# Patient Record
Sex: Male | Born: 1960 | Race: Black or African American | Hispanic: No
Health system: Southern US, Community
[De-identification: ages and names within clinical notes are randomized; demographics above are authoritative.]

## PROBLEM LIST (undated history)

## (undated) DIAGNOSIS — Z7901 Long term (current) use of anticoagulants: Secondary | ICD-10-CM

## (undated) DIAGNOSIS — I10 Essential (primary) hypertension: Secondary | ICD-10-CM

## (undated) DIAGNOSIS — I2699 Other pulmonary embolism without acute cor pulmonale: Secondary | ICD-10-CM

## (undated) DIAGNOSIS — F17200 Nicotine dependence, unspecified, uncomplicated: Secondary | ICD-10-CM

## (undated) DIAGNOSIS — I82409 Acute embolism and thrombosis of unspecified deep veins of unspecified lower extremity: Secondary | ICD-10-CM

## (undated) DIAGNOSIS — I2510 Atherosclerotic heart disease of native coronary artery without angina pectoris: Secondary | ICD-10-CM

## (undated) DIAGNOSIS — E785 Hyperlipidemia, unspecified: Secondary | ICD-10-CM

## (undated) DIAGNOSIS — Z9861 Coronary angioplasty status: Secondary | ICD-10-CM

## (undated) DIAGNOSIS — I219 Acute myocardial infarction, unspecified: Secondary | ICD-10-CM

## (undated) HISTORY — PX: CHOLECYSTECTOMY: SHX55

## (undated) HISTORY — PX: OTHER SURGICAL HISTORY: SHX169

---

## 1999-02-15 ENCOUNTER — Encounter: Payer: Self-pay | Admitting: Emergency Medicine

## 1999-02-16 ENCOUNTER — Inpatient Hospital Stay (HOSPITAL_COMMUNITY): Admission: EM | Admit: 1999-02-16 | Discharge: 1999-02-20 | Payer: Self-pay | Admitting: Emergency Medicine

## 1999-02-16 ENCOUNTER — Encounter: Payer: Self-pay | Admitting: General Surgery

## 1999-09-08 ENCOUNTER — Encounter: Payer: Self-pay | Admitting: Emergency Medicine

## 1999-09-08 ENCOUNTER — Emergency Department (HOSPITAL_COMMUNITY): Admission: EM | Admit: 1999-09-08 | Discharge: 1999-09-08 | Payer: Self-pay | Admitting: Emergency Medicine

## 1999-09-20 ENCOUNTER — Ambulatory Visit (HOSPITAL_COMMUNITY): Admission: RE | Admit: 1999-09-20 | Discharge: 1999-09-20 | Payer: Self-pay | Admitting: Hematology and Oncology

## 1999-09-20 ENCOUNTER — Encounter: Payer: Self-pay | Admitting: Hematology and Oncology

## 1999-09-20 ENCOUNTER — Encounter: Admission: RE | Admit: 1999-09-20 | Discharge: 1999-09-20 | Payer: Self-pay | Admitting: Hematology and Oncology

## 1999-10-02 ENCOUNTER — Encounter: Admission: RE | Admit: 1999-10-02 | Discharge: 1999-12-31 | Payer: Self-pay | Admitting: Orthopedic Surgery

## 1999-12-18 ENCOUNTER — Encounter (HOSPITAL_BASED_OUTPATIENT_CLINIC_OR_DEPARTMENT_OTHER): Payer: Self-pay | Admitting: Internal Medicine

## 2000-02-18 ENCOUNTER — Encounter: Admission: RE | Admit: 2000-02-18 | Discharge: 2000-05-18 | Payer: Self-pay | Admitting: Internal Medicine

## 2000-03-21 ENCOUNTER — Encounter: Payer: Self-pay | Admitting: Emergency Medicine

## 2000-03-21 ENCOUNTER — Emergency Department (HOSPITAL_COMMUNITY): Admission: EM | Admit: 2000-03-21 | Discharge: 2000-03-21 | Payer: Self-pay | Admitting: Emergency Medicine

## 2000-04-18 ENCOUNTER — Encounter: Payer: Self-pay | Admitting: Emergency Medicine

## 2000-04-18 ENCOUNTER — Encounter: Payer: Self-pay | Admitting: Internal Medicine

## 2000-04-18 ENCOUNTER — Inpatient Hospital Stay (HOSPITAL_COMMUNITY): Admission: EM | Admit: 2000-04-18 | Discharge: 2000-04-24 | Payer: Self-pay | Admitting: Emergency Medicine

## 2000-04-21 ENCOUNTER — Encounter: Payer: Self-pay | Admitting: Internal Medicine

## 2000-06-25 ENCOUNTER — Emergency Department (HOSPITAL_COMMUNITY): Admission: EM | Admit: 2000-06-25 | Discharge: 2000-06-25 | Payer: Self-pay

## 2000-06-25 ENCOUNTER — Encounter: Payer: Self-pay | Admitting: Emergency Medicine

## 2001-02-11 ENCOUNTER — Encounter: Admission: RE | Admit: 2001-02-11 | Discharge: 2001-02-23 | Payer: Self-pay | Admitting: Internal Medicine

## 2001-09-29 ENCOUNTER — Emergency Department (HOSPITAL_COMMUNITY): Admission: EM | Admit: 2001-09-29 | Discharge: 2001-09-29 | Payer: Self-pay | Admitting: *Deleted

## 2001-12-22 ENCOUNTER — Inpatient Hospital Stay (HOSPITAL_COMMUNITY): Admission: EM | Admit: 2001-12-22 | Discharge: 2001-12-30 | Payer: Self-pay | Admitting: Emergency Medicine

## 2001-12-22 ENCOUNTER — Encounter: Payer: Self-pay | Admitting: Emergency Medicine

## 2002-05-03 ENCOUNTER — Encounter (HOSPITAL_BASED_OUTPATIENT_CLINIC_OR_DEPARTMENT_OTHER): Admission: RE | Admit: 2002-05-03 | Discharge: 2002-07-21 | Payer: Self-pay | Admitting: Internal Medicine

## 2002-05-16 ENCOUNTER — Encounter: Payer: Self-pay | Admitting: Emergency Medicine

## 2002-05-16 ENCOUNTER — Inpatient Hospital Stay (HOSPITAL_COMMUNITY): Admission: EM | Admit: 2002-05-16 | Discharge: 2002-05-24 | Payer: Self-pay | Admitting: Emergency Medicine

## 2002-05-31 ENCOUNTER — Encounter: Admission: RE | Admit: 2002-05-31 | Discharge: 2002-05-31 | Payer: Self-pay | Admitting: Family Medicine

## 2002-08-16 ENCOUNTER — Encounter (HOSPITAL_BASED_OUTPATIENT_CLINIC_OR_DEPARTMENT_OTHER): Admission: RE | Admit: 2002-08-16 | Discharge: 2002-11-14 | Payer: Self-pay | Admitting: Internal Medicine

## 2002-09-21 ENCOUNTER — Emergency Department (HOSPITAL_COMMUNITY): Admission: EM | Admit: 2002-09-21 | Discharge: 2002-09-21 | Payer: Self-pay | Admitting: Emergency Medicine

## 2002-09-21 ENCOUNTER — Encounter: Payer: Self-pay | Admitting: Emergency Medicine

## 2002-11-29 ENCOUNTER — Encounter (HOSPITAL_BASED_OUTPATIENT_CLINIC_OR_DEPARTMENT_OTHER): Admission: RE | Admit: 2002-11-29 | Discharge: 2003-02-27 | Payer: Self-pay | Admitting: Internal Medicine

## 2003-01-27 ENCOUNTER — Emergency Department (HOSPITAL_COMMUNITY): Admission: EM | Admit: 2003-01-27 | Discharge: 2003-01-27 | Payer: Self-pay | Admitting: Emergency Medicine

## 2003-01-27 ENCOUNTER — Encounter: Payer: Self-pay | Admitting: Emergency Medicine

## 2003-03-18 ENCOUNTER — Encounter (HOSPITAL_BASED_OUTPATIENT_CLINIC_OR_DEPARTMENT_OTHER): Admission: RE | Admit: 2003-03-18 | Discharge: 2003-06-08 | Payer: Self-pay | Admitting: Internal Medicine

## 2003-04-05 ENCOUNTER — Emergency Department (HOSPITAL_COMMUNITY): Admission: EM | Admit: 2003-04-05 | Discharge: 2003-04-05 | Payer: Self-pay | Admitting: Emergency Medicine

## 2003-04-05 ENCOUNTER — Encounter: Payer: Self-pay | Admitting: Emergency Medicine

## 2003-05-06 ENCOUNTER — Emergency Department (HOSPITAL_COMMUNITY): Admission: EM | Admit: 2003-05-06 | Discharge: 2003-05-06 | Payer: Self-pay | Admitting: Emergency Medicine

## 2003-05-06 ENCOUNTER — Encounter: Payer: Self-pay | Admitting: Emergency Medicine

## 2003-06-14 ENCOUNTER — Emergency Department (HOSPITAL_COMMUNITY): Admission: EM | Admit: 2003-06-14 | Discharge: 2003-06-14 | Payer: Self-pay | Admitting: Emergency Medicine

## 2003-06-14 ENCOUNTER — Encounter: Payer: Self-pay | Admitting: Emergency Medicine

## 2003-07-01 ENCOUNTER — Encounter (HOSPITAL_BASED_OUTPATIENT_CLINIC_OR_DEPARTMENT_OTHER): Admission: RE | Admit: 2003-07-01 | Discharge: 2003-09-30 | Payer: Self-pay | Admitting: Internal Medicine

## 2003-07-05 ENCOUNTER — Encounter: Admission: RE | Admit: 2003-07-05 | Discharge: 2003-07-05 | Payer: Self-pay | Admitting: Surgery

## 2003-07-26 ENCOUNTER — Ambulatory Visit (HOSPITAL_COMMUNITY): Admission: RE | Admit: 2003-07-26 | Discharge: 2003-07-26 | Payer: Self-pay | Admitting: General Surgery

## 2003-10-20 ENCOUNTER — Encounter (HOSPITAL_BASED_OUTPATIENT_CLINIC_OR_DEPARTMENT_OTHER): Admission: RE | Admit: 2003-10-20 | Discharge: 2004-01-03 | Payer: Self-pay | Admitting: Internal Medicine

## 2003-10-28 ENCOUNTER — Ambulatory Visit (HOSPITAL_COMMUNITY): Admission: RE | Admit: 2003-10-28 | Discharge: 2003-10-28 | Payer: Self-pay | Admitting: Internal Medicine

## 2004-01-18 ENCOUNTER — Encounter (HOSPITAL_BASED_OUTPATIENT_CLINIC_OR_DEPARTMENT_OTHER): Admission: RE | Admit: 2004-01-18 | Discharge: 2004-03-26 | Payer: Self-pay | Admitting: Internal Medicine

## 2004-03-23 ENCOUNTER — Emergency Department (HOSPITAL_COMMUNITY): Admission: EM | Admit: 2004-03-23 | Discharge: 2004-03-23 | Payer: Self-pay | Admitting: Emergency Medicine

## 2004-04-18 ENCOUNTER — Emergency Department (HOSPITAL_COMMUNITY): Admission: EM | Admit: 2004-04-18 | Discharge: 2004-04-18 | Payer: Self-pay | Admitting: Emergency Medicine

## 2004-04-25 ENCOUNTER — Encounter (HOSPITAL_BASED_OUTPATIENT_CLINIC_OR_DEPARTMENT_OTHER): Admission: RE | Admit: 2004-04-25 | Discharge: 2004-07-03 | Payer: Self-pay | Admitting: Internal Medicine

## 2004-05-09 IMAGING — NM NM LIVER FUNCTION STUDY
2 series · 12 of 12 positions shown · non-contrast
Comparison: none

CLINICAL DATA: Right-sided abdominal pain.  Known gallstones.  Evaluate for cystic or biliary obstruction.
 HEPATOBILIARY SCAN
 The patient was injected with 5.2 mCi GGmFc Choletec intravenously.  
 Prompt radiopharmaceutical uptake by the liver is seen.  The liver has a normal configuration.
 Prompt biliary excretion of activity is seen.  Gallbladder activity is seen initially on the 10 minute image, and biliary activity reaches the small bowel on the 20 minute image.  
 IMPRESSION
 Normal study.

[Series 1: he hepatobiliary · 3.22mm/px · 6 of 36 frames shown (1 of 2)]
[frame 4/36]
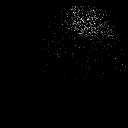
[frame 10/36]
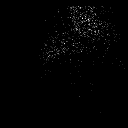
[frame 16/36]
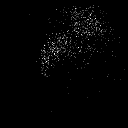
[frame 22/36]
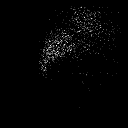
[frame 28/36]
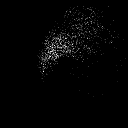
[frame 34/36]
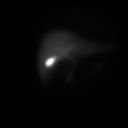

[Series 1: he hepatobiliary · 3.22mm/px · 6 of 36 frames shown (2 of 2)]
[frame 4/36]
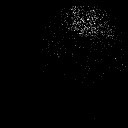
[frame 10/36]
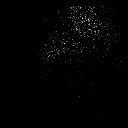
[frame 16/36]
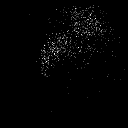
[frame 22/36]
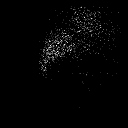
[frame 28/36]
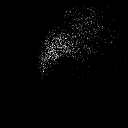
[frame 34/36]
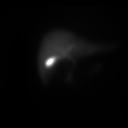

[12 of 12 positions shown; findings below may reference images not displayed]

## 2004-05-24 ENCOUNTER — Ambulatory Visit: Payer: Self-pay | Admitting: Nurse Practitioner

## 2004-05-31 ENCOUNTER — Emergency Department (HOSPITAL_COMMUNITY): Admission: EM | Admit: 2004-05-31 | Discharge: 2004-05-31 | Payer: Self-pay | Admitting: *Deleted

## 2004-06-25 ENCOUNTER — Ambulatory Visit: Payer: Self-pay | Admitting: Nurse Practitioner

## 2004-07-10 ENCOUNTER — Ambulatory Visit: Payer: Self-pay | Admitting: Internal Medicine

## 2004-07-24 ENCOUNTER — Encounter (HOSPITAL_BASED_OUTPATIENT_CLINIC_OR_DEPARTMENT_OTHER): Admission: RE | Admit: 2004-07-24 | Discharge: 2004-10-15 | Payer: Self-pay | Admitting: Internal Medicine

## 2004-08-01 ENCOUNTER — Ambulatory Visit: Payer: Self-pay | Admitting: Nurse Practitioner

## 2004-09-05 ENCOUNTER — Ambulatory Visit: Payer: Self-pay | Admitting: Nurse Practitioner

## 2004-10-04 ENCOUNTER — Ambulatory Visit: Payer: Self-pay | Admitting: Nurse Practitioner

## 2004-11-07 ENCOUNTER — Ambulatory Visit: Payer: Self-pay | Admitting: Nurse Practitioner

## 2004-12-10 ENCOUNTER — Ambulatory Visit: Payer: Self-pay | Admitting: Nurse Practitioner

## 2004-12-10 ENCOUNTER — Encounter (HOSPITAL_BASED_OUTPATIENT_CLINIC_OR_DEPARTMENT_OTHER): Admission: RE | Admit: 2004-12-10 | Discharge: 2005-03-10 | Payer: Self-pay | Admitting: Surgery

## 2005-01-14 ENCOUNTER — Ambulatory Visit: Payer: Self-pay | Admitting: Nurse Practitioner

## 2005-02-19 ENCOUNTER — Ambulatory Visit: Payer: Self-pay | Admitting: Nurse Practitioner

## 2005-02-26 ENCOUNTER — Ambulatory Visit: Payer: Self-pay | Admitting: Nurse Practitioner

## 2005-03-28 ENCOUNTER — Ambulatory Visit: Payer: Self-pay | Admitting: Nurse Practitioner

## 2005-04-11 ENCOUNTER — Ambulatory Visit: Payer: Self-pay | Admitting: Nurse Practitioner

## 2005-04-12 ENCOUNTER — Encounter (HOSPITAL_BASED_OUTPATIENT_CLINIC_OR_DEPARTMENT_OTHER): Admission: RE | Admit: 2005-04-12 | Discharge: 2005-07-11 | Payer: Self-pay | Admitting: Surgery

## 2005-05-22 ENCOUNTER — Ambulatory Visit: Payer: Self-pay | Admitting: Nurse Practitioner

## 2005-06-13 ENCOUNTER — Emergency Department (HOSPITAL_COMMUNITY): Admission: EM | Admit: 2005-06-13 | Discharge: 2005-06-13 | Payer: Self-pay | Admitting: Emergency Medicine

## 2005-07-23 ENCOUNTER — Encounter: Admission: RE | Admit: 2005-07-23 | Discharge: 2005-07-23 | Payer: Self-pay | Admitting: Internal Medicine

## 2006-09-18 ENCOUNTER — Emergency Department (HOSPITAL_COMMUNITY): Admission: EM | Admit: 2006-09-18 | Discharge: 2006-09-18 | Payer: Self-pay | Admitting: Emergency Medicine

## 2008-11-23 ENCOUNTER — Encounter (INDEPENDENT_AMBULATORY_CARE_PROVIDER_SITE_OTHER): Payer: Self-pay | Admitting: Internal Medicine

## 2008-11-23 ENCOUNTER — Ambulatory Visit (HOSPITAL_COMMUNITY): Admission: RE | Admit: 2008-11-23 | Discharge: 2008-11-23 | Payer: Self-pay | Admitting: Internal Medicine

## 2008-11-23 ENCOUNTER — Ambulatory Visit: Payer: Self-pay | Admitting: Internal Medicine

## 2008-11-23 LAB — CONVERTED CEMR LAB
AST: 24 units/L (ref 0–37)
Amphetamine Screen, Ur: NEGATIVE
BUN: 13 mg/dL (ref 6–23)
Benzodiazepines.: NEGATIVE
CO2: 25 meq/L (ref 19–32)
Calcium: 10.3 mg/dL (ref 8.4–10.5)
Chloride: 102 meq/L (ref 96–112)
Cocaine Metabolites: NEGATIVE
Creatinine, Ser: 1.05 mg/dL (ref 0.40–1.50)
Marijuana Metabolite: NEGATIVE
Total Bilirubin: 0.3 mg/dL (ref 0.3–1.2)

## 2008-11-30 ENCOUNTER — Ambulatory Visit: Payer: Self-pay | Admitting: Internal Medicine

## 2008-12-07 ENCOUNTER — Ambulatory Visit: Payer: Self-pay | Admitting: Internal Medicine

## 2008-12-16 ENCOUNTER — Ambulatory Visit: Payer: Self-pay | Admitting: Internal Medicine

## 2008-12-20 ENCOUNTER — Ambulatory Visit (HOSPITAL_COMMUNITY): Admission: RE | Admit: 2008-12-20 | Discharge: 2008-12-20 | Payer: Self-pay | Admitting: Internal Medicine

## 2009-01-05 ENCOUNTER — Ambulatory Visit: Payer: Self-pay | Admitting: *Deleted

## 2009-01-05 ENCOUNTER — Ambulatory Visit: Payer: Self-pay | Admitting: Internal Medicine

## 2009-01-24 ENCOUNTER — Ambulatory Visit: Payer: Self-pay | Admitting: Internal Medicine

## 2009-01-24 ENCOUNTER — Ambulatory Visit: Payer: Self-pay | Admitting: Family Medicine

## 2009-02-14 ENCOUNTER — Emergency Department (HOSPITAL_COMMUNITY): Admission: EM | Admit: 2009-02-14 | Discharge: 2009-02-14 | Payer: Self-pay | Admitting: Emergency Medicine

## 2009-02-19 ENCOUNTER — Emergency Department (HOSPITAL_COMMUNITY): Admission: EM | Admit: 2009-02-19 | Discharge: 2009-02-19 | Payer: Self-pay | Admitting: Emergency Medicine

## 2009-02-20 ENCOUNTER — Emergency Department (HOSPITAL_COMMUNITY): Admission: EM | Admit: 2009-02-20 | Discharge: 2009-02-20 | Payer: Self-pay | Admitting: Emergency Medicine

## 2009-02-21 ENCOUNTER — Ambulatory Visit: Payer: Self-pay | Admitting: Internal Medicine

## 2009-03-23 ENCOUNTER — Ambulatory Visit: Payer: Self-pay | Admitting: Internal Medicine

## 2009-04-27 ENCOUNTER — Ambulatory Visit: Payer: Self-pay | Admitting: Internal Medicine

## 2009-05-25 ENCOUNTER — Ambulatory Visit: Payer: Self-pay | Admitting: Internal Medicine

## 2009-06-22 ENCOUNTER — Ambulatory Visit: Payer: Self-pay | Admitting: Family Medicine

## 2009-07-25 ENCOUNTER — Ambulatory Visit: Payer: Self-pay | Admitting: Internal Medicine

## 2009-08-06 ENCOUNTER — Emergency Department (HOSPITAL_COMMUNITY): Admission: EM | Admit: 2009-08-06 | Discharge: 2009-08-06 | Payer: Self-pay | Admitting: Family Medicine

## 2009-08-23 ENCOUNTER — Ambulatory Visit: Payer: Self-pay | Admitting: Internal Medicine

## 2009-08-24 ENCOUNTER — Emergency Department (HOSPITAL_COMMUNITY): Admission: EM | Admit: 2009-08-24 | Discharge: 2009-08-25 | Payer: Self-pay | Admitting: Emergency Medicine

## 2009-09-20 ENCOUNTER — Encounter (INDEPENDENT_AMBULATORY_CARE_PROVIDER_SITE_OTHER): Payer: Self-pay | Admitting: Internal Medicine

## 2009-09-20 ENCOUNTER — Ambulatory Visit: Payer: Self-pay | Admitting: Family Medicine

## 2009-09-20 ENCOUNTER — Ambulatory Visit: Payer: Self-pay | Admitting: Internal Medicine

## 2009-09-20 LAB — CONVERTED CEMR LAB
INR: 2.85 — ABNORMAL HIGH (ref <1.50)
Prothrombin Time: 29.7 s — ABNORMAL HIGH (ref 11.6–15.2)

## 2009-09-24 ENCOUNTER — Emergency Department (HOSPITAL_COMMUNITY): Admission: EM | Admit: 2009-09-24 | Discharge: 2009-09-24 | Payer: Self-pay | Admitting: Emergency Medicine

## 2009-10-04 IMAGING — US US ABDOMEN COMPLETE
1 series · 14 of 25 positions shown · non-contrast
Comparison: None

CLINICAL DATA: Abdominal pain

COMPLETE ABDOMINAL ULTRASOUND

[Series 1: us abdomen complete · 0.31mm/px · 14 of 67 slices shown]
[im 1/67]
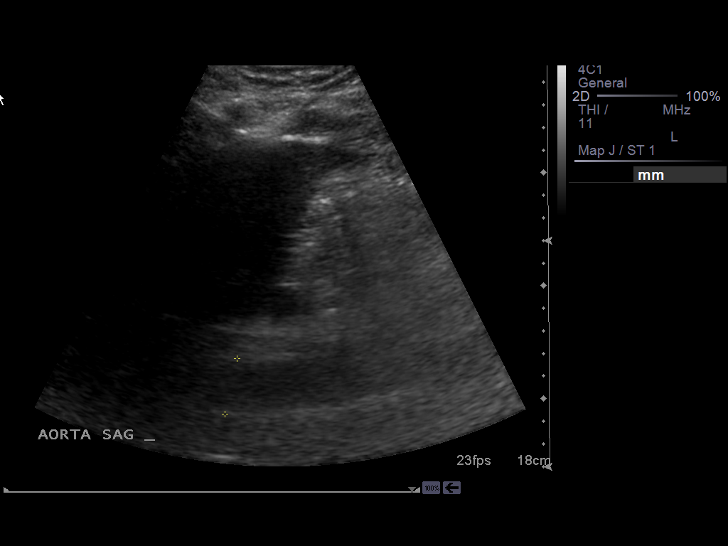
[im 6/67]
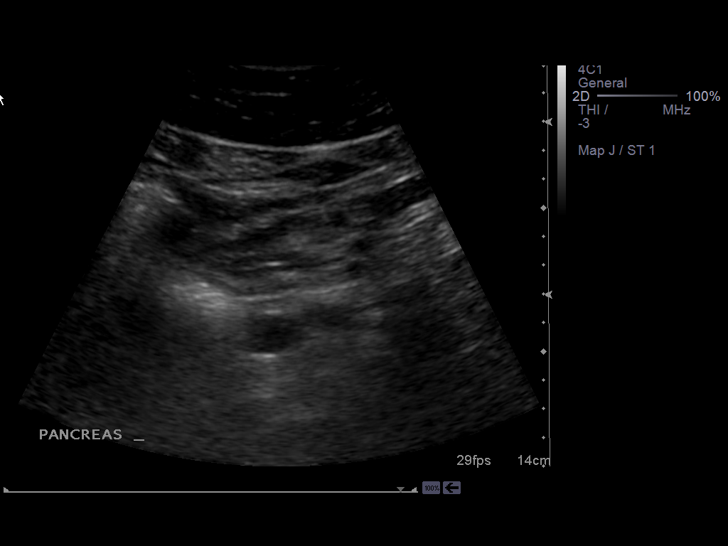
[im 12/67]
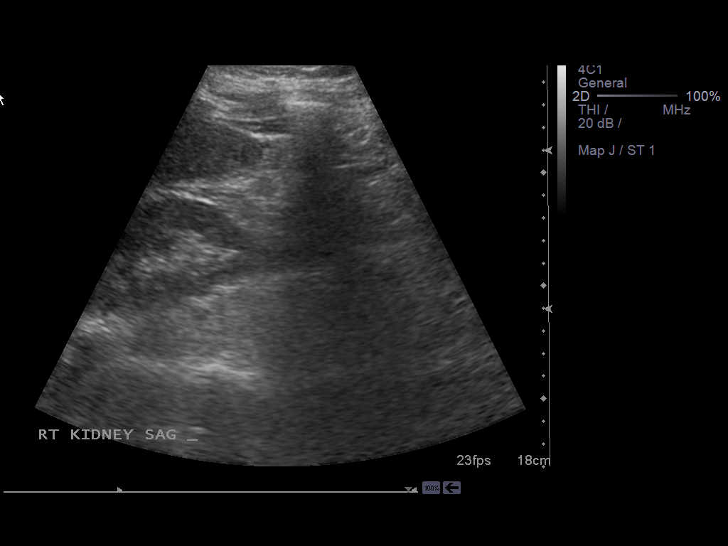
[im 17/67]
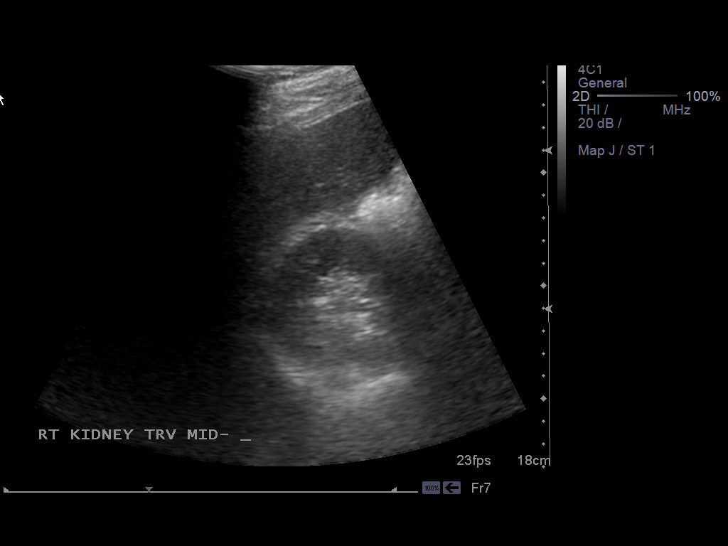
[im 23/67]
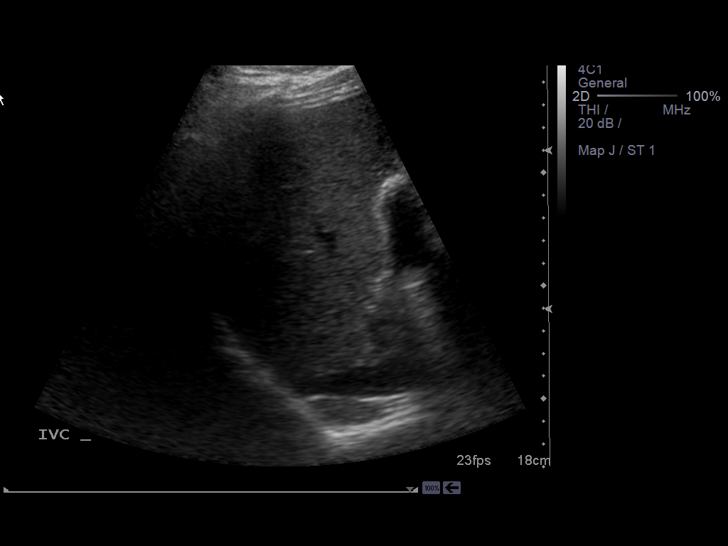
[im 25/67]
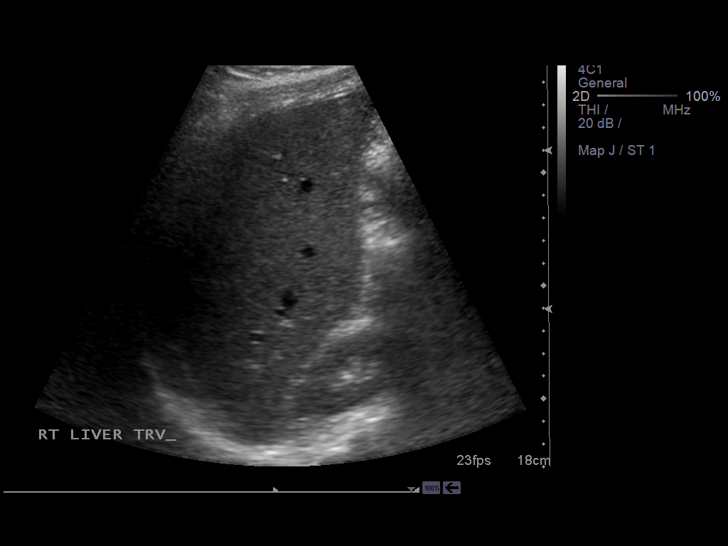
[im 31/67]
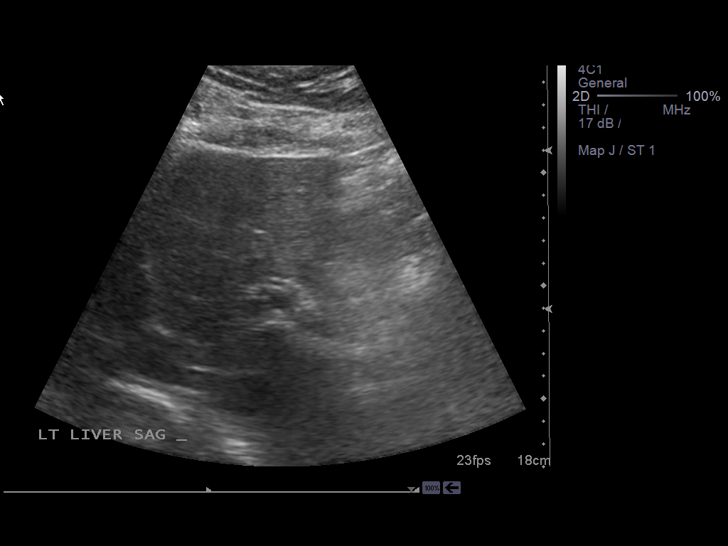
[im 36/67]
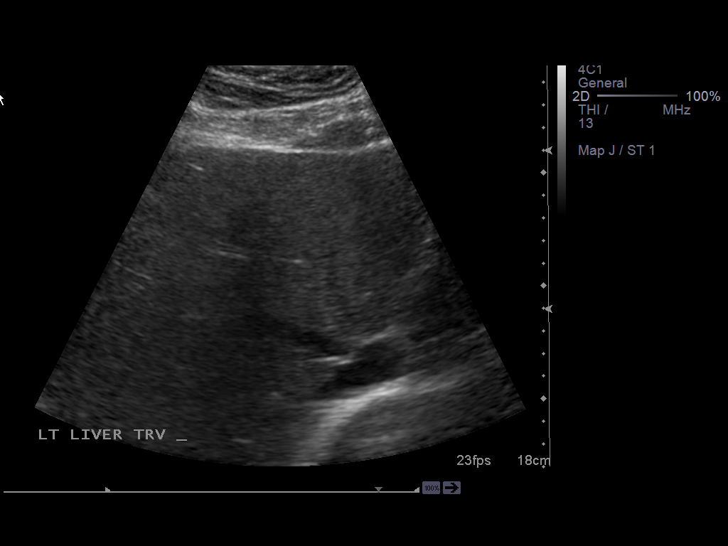
[im 42/67]
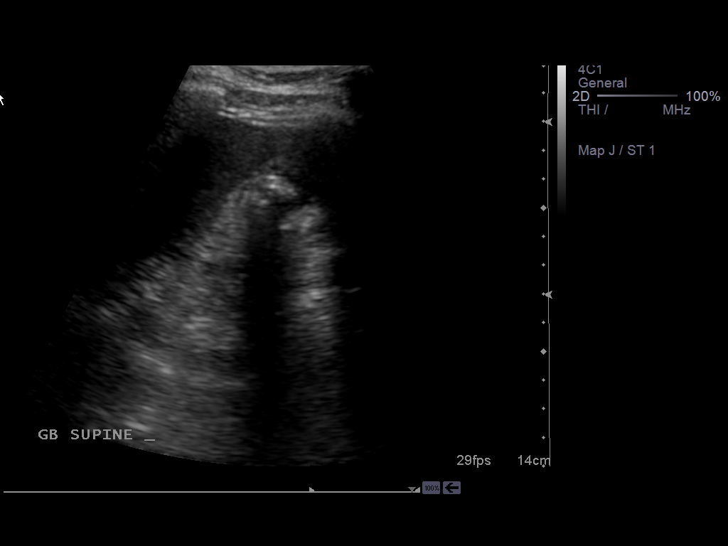
[im 45/67]
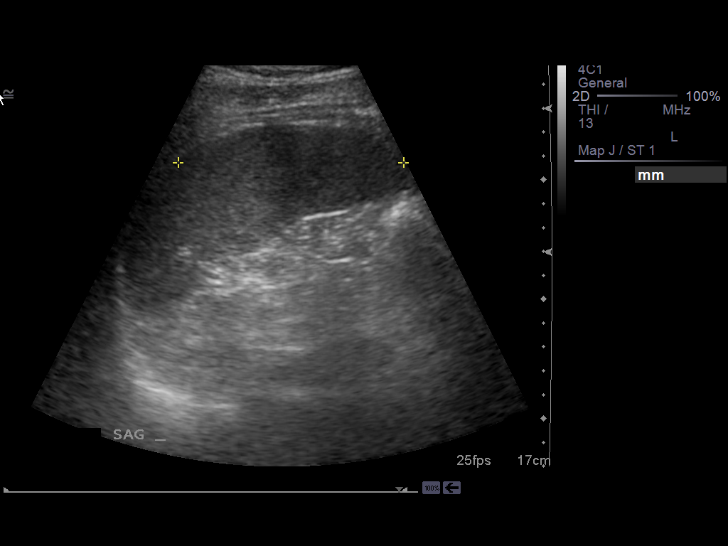
[im 50/67]
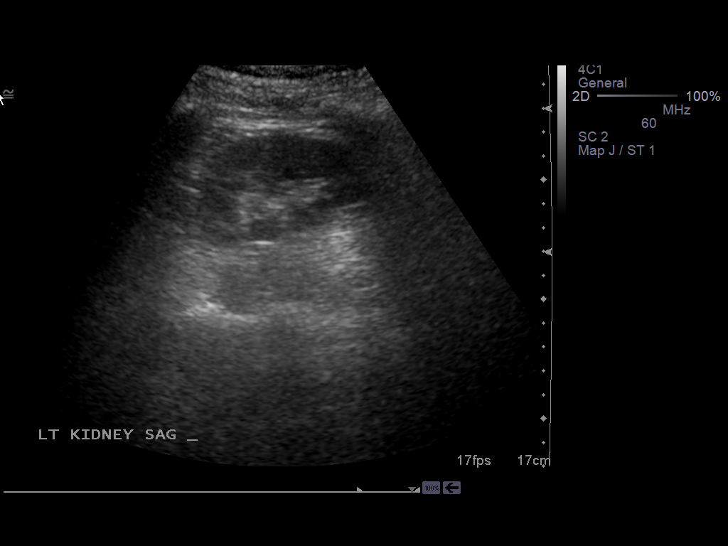
[im 56/67]
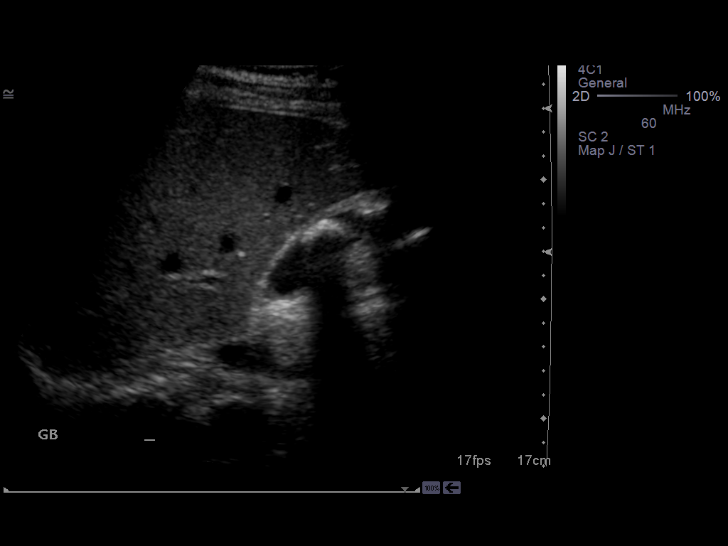
[im 61/67]
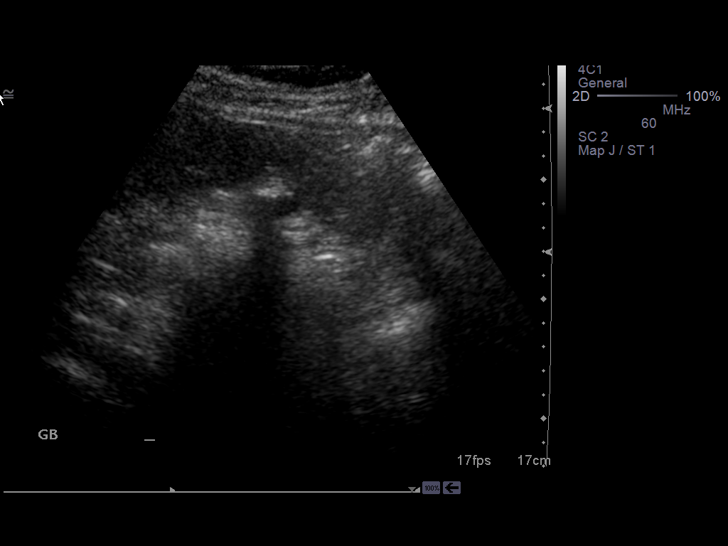
[im 67/67]
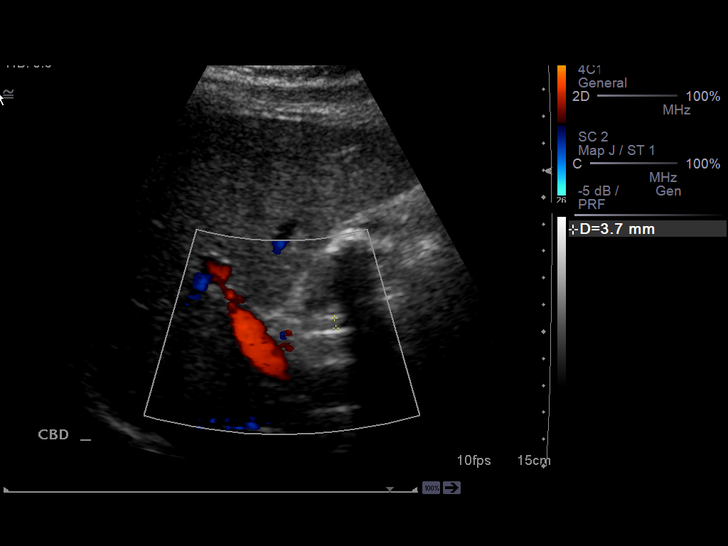

[14 of 25 positions shown; findings below may reference images not displayed]

FINDINGS: Gallbladder:  Gallstones measuring up to 2 cm in diameter.
Negative Murphy's sign.  Gallbladder wall thickness is 3.4 mm.

Common bile duct:  3.7 mm in diameter.

Liver:  Unremarkable.

IVC:  Patent

Pancreas:  Poor visualization of the pancreas due to adjacent bowel
gas.

Spleen:  Negative.  9.4 cm length.

Right Kidney:  No mass or hydronephrosis.  11.1 cm length.

Left Kidney:  No mass or hydronephrosis.  11.4 cm in length.

Abdominal aorta:  2.5 cm maximum diameter.  No aortic aneurysm.
IMPRESSION: Gallstones.  No biliary ductal dilatation.

## 2009-10-13 ENCOUNTER — Ambulatory Visit: Payer: Self-pay | Admitting: Internal Medicine

## 2009-10-18 ENCOUNTER — Ambulatory Visit: Payer: Self-pay | Admitting: Internal Medicine

## 2009-10-18 ENCOUNTER — Ambulatory Visit (HOSPITAL_COMMUNITY): Admission: RE | Admit: 2009-10-18 | Discharge: 2009-10-18 | Payer: Self-pay | Admitting: Internal Medicine

## 2009-11-14 ENCOUNTER — Encounter (INDEPENDENT_AMBULATORY_CARE_PROVIDER_SITE_OTHER): Payer: Self-pay | Admitting: General Surgery

## 2009-11-14 ENCOUNTER — Ambulatory Visit (HOSPITAL_COMMUNITY): Admission: RE | Admit: 2009-11-14 | Discharge: 2009-11-15 | Payer: Self-pay | Admitting: General Surgery

## 2009-11-20 ENCOUNTER — Ambulatory Visit: Payer: Self-pay | Admitting: Internal Medicine

## 2009-12-28 ENCOUNTER — Ambulatory Visit: Payer: Self-pay | Admitting: Internal Medicine

## 2010-01-25 ENCOUNTER — Ambulatory Visit: Payer: Self-pay | Admitting: Internal Medicine

## 2010-02-22 ENCOUNTER — Ambulatory Visit: Payer: Self-pay | Admitting: Internal Medicine

## 2010-03-26 ENCOUNTER — Ambulatory Visit: Payer: Self-pay | Admitting: Internal Medicine

## 2010-04-23 ENCOUNTER — Ambulatory Visit: Payer: Self-pay | Admitting: Internal Medicine

## 2010-05-22 ENCOUNTER — Ambulatory Visit: Payer: Self-pay | Admitting: Internal Medicine

## 2010-05-22 LAB — CONVERTED CEMR LAB: INR: 2.08 — ABNORMAL HIGH (ref <1.50)

## 2010-06-01 ENCOUNTER — Encounter (INDEPENDENT_AMBULATORY_CARE_PROVIDER_SITE_OTHER): Payer: Self-pay | Admitting: *Deleted

## 2010-06-01 LAB — CONVERTED CEMR LAB
Calcium: 9.8 mg/dL (ref 8.4–10.5)
Sodium: 139 meq/L (ref 135–145)

## 2010-06-08 IMAGING — CR DG ABDOMEN 2V
2 series · 2 of 2 positions shown · non-contrast
Comparison: 02/20/2009

CLINICAL DATA: Abdominal pain.

ABDOMEN - 2 VIEW

[w abdomen upright]
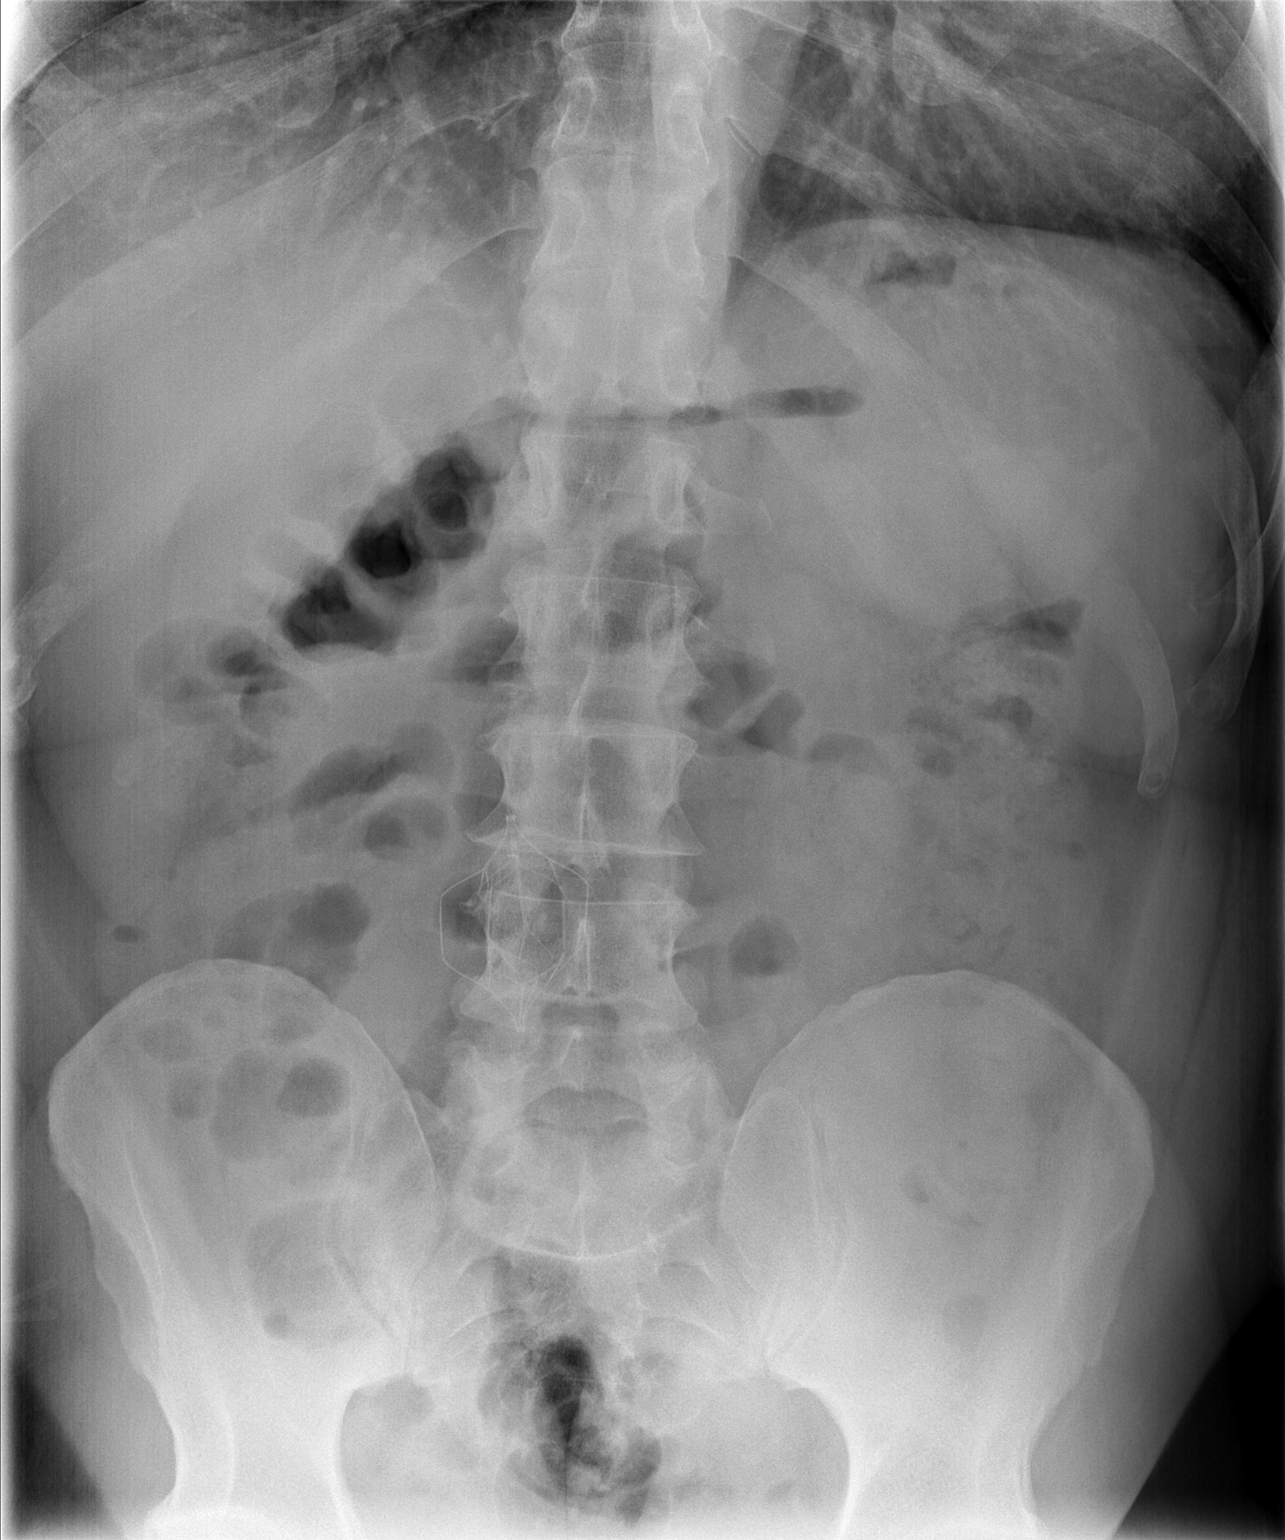

[t abdomen supine]
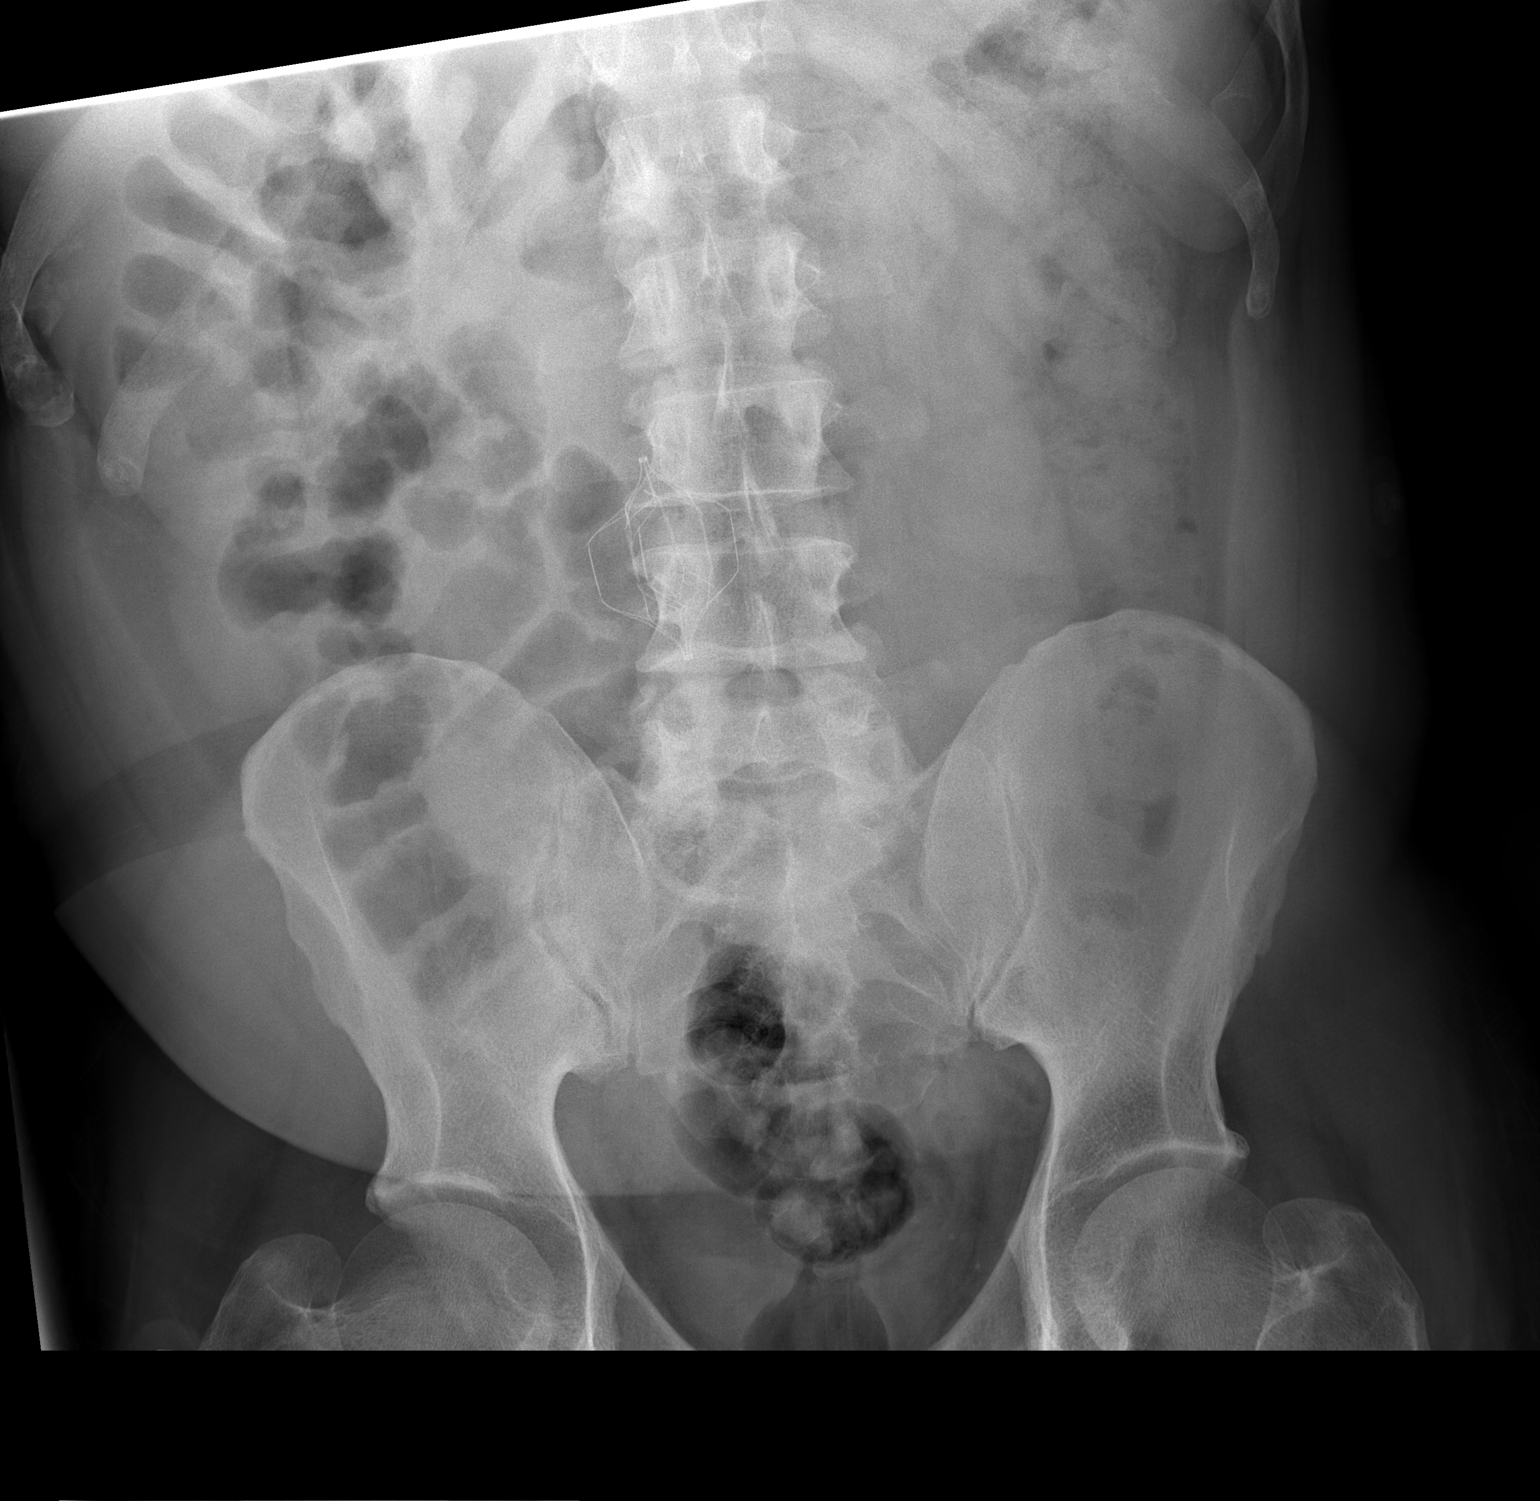

[2 of 2 positions shown; findings below may reference images not displayed]

FINDINGS: Gas in the colon and rectum are noted.
Nondistended gas-filled loops of small bowel are present within the
right abdomen.
No dilated bowel loops or pneumoperitoneum identified.
An IVC filter is present.
No acute bony abnormalities are identified.
IMPRESSION: Nonspecific nonobstructive bowel gas pattern.  No evidence of
pneumoperitoneum.

## 2010-06-11 ENCOUNTER — Inpatient Hospital Stay (HOSPITAL_COMMUNITY): Admission: EM | Admit: 2010-06-11 | Discharge: 2010-06-12 | Payer: Self-pay | Admitting: Emergency Medicine

## 2010-06-11 ENCOUNTER — Encounter (INDEPENDENT_AMBULATORY_CARE_PROVIDER_SITE_OTHER): Payer: Self-pay | Admitting: Internal Medicine

## 2010-06-11 ENCOUNTER — Ambulatory Visit: Payer: Self-pay | Admitting: Cardiovascular Disease

## 2010-06-20 ENCOUNTER — Encounter (INDEPENDENT_AMBULATORY_CARE_PROVIDER_SITE_OTHER): Payer: Self-pay | Admitting: *Deleted

## 2010-06-20 LAB — CONVERTED CEMR LAB
INR: 1.95 — ABNORMAL HIGH (ref <1.50)
Prothrombin Time: 22.4 s — ABNORMAL HIGH (ref 11.6–15.2)

## 2010-08-16 ENCOUNTER — Encounter (INDEPENDENT_AMBULATORY_CARE_PROVIDER_SITE_OTHER): Payer: Self-pay | Admitting: Internal Medicine

## 2010-08-16 LAB — CONVERTED CEMR LAB
INR: 1.84 — ABNORMAL HIGH (ref <1.50)
Prothrombin Time: 21.4 s — ABNORMAL HIGH (ref 11.6–15.2)

## 2010-08-24 ENCOUNTER — Emergency Department (HOSPITAL_COMMUNITY)
Admission: EM | Admit: 2010-08-24 | Discharge: 2010-08-24 | Payer: Self-pay | Source: Home / Self Care | Admitting: Emergency Medicine

## 2010-08-24 ENCOUNTER — Emergency Department (HOSPITAL_COMMUNITY)
Admission: EM | Admit: 2010-08-24 | Discharge: 2010-08-24 | Disposition: A | Discharge status: Other Acute Inpatient Hospital | Payer: Self-pay | Source: Home / Self Care | Admitting: Family Medicine

## 2010-11-19 LAB — DIFFERENTIAL
Eosinophils Absolute: 0.1 10*3/uL (ref 0.0–0.7)
Eosinophils Relative: 3 % (ref 0–5)
Lymphs Abs: 2.7 10*3/uL (ref 0.7–4.0)

## 2010-11-19 LAB — POCT I-STAT, CHEM 8
BUN: 16 mg/dL (ref 6–23)
Calcium, Ion: 1.2 mmol/L (ref 1.12–1.32)
Chloride: 104 mEq/L (ref 96–112)
Glucose, Bld: 84 mg/dL (ref 70–99)
HCT: 46 % (ref 39.0–52.0)

## 2010-11-19 LAB — CBC
MCH: 29.3 pg (ref 26.0–34.0)
MCV: 86.2 fL (ref 78.0–100.0)
Platelets: 218 10*3/uL (ref 150–400)
RBC: 5.15 MIL/uL (ref 4.22–5.81)

## 2010-11-19 LAB — URINALYSIS, ROUTINE W REFLEX MICROSCOPIC
Bilirubin Urine: NEGATIVE
Ketones, ur: NEGATIVE mg/dL
Nitrite: NEGATIVE
Protein, ur: NEGATIVE mg/dL
Urobilinogen, UA: 1 mg/dL (ref 0.0–1.0)

## 2010-11-19 LAB — PROTIME-INR: Prothrombin Time: 29.7 seconds — ABNORMAL HIGH (ref 11.6–15.2)

## 2010-11-22 LAB — POCT I-STAT, CHEM 8
Calcium, Ion: 1.13 mmol/L (ref 1.12–1.32)
Glucose, Bld: 133 mg/dL — ABNORMAL HIGH (ref 70–99)
Potassium: 3.2 mEq/L — ABNORMAL LOW (ref 3.5–5.1)
Sodium: 137 mEq/L (ref 135–145)

## 2010-11-22 LAB — POCT CARDIAC MARKERS
Myoglobin, poc: 101 ng/mL (ref 12–200)
Troponin i, poc: <0.05 ng/mL (ref 0.00–0.09)

## 2010-11-22 LAB — DIFFERENTIAL
Basophils Absolute: 0 10*3/uL (ref 0.0–0.1)
Basophils Relative: 0 % (ref 0–1)
Eosinophils Absolute: 0 10*3/uL (ref 0.0–0.7)
Eosinophils Relative: 0 % (ref 0–5)
Monocytes Absolute: 0.4 10*3/uL (ref 0.1–1.0)

## 2010-11-22 LAB — BASIC METABOLIC PANEL
CO2: 30 mEq/L (ref 19–32)
Chloride: 100 mEq/L (ref 96–112)
Glucose, Bld: 84 mg/dL (ref 70–99)
Potassium: 3.2 mEq/L — ABNORMAL LOW (ref 3.5–5.1)
Sodium: 135 mEq/L (ref 135–145)

## 2010-11-22 LAB — URINALYSIS, ROUTINE W REFLEX MICROSCOPIC
Leukocytes, UA: NEGATIVE
Protein, ur: NEGATIVE mg/dL
Urobilinogen, UA: 0.2 mg/dL (ref 0.0–1.0)

## 2010-11-22 LAB — RAPID URINE DRUG SCREEN, HOSP PERFORMED
Amphetamines: NOT DETECTED
Benzodiazepines: NOT DETECTED

## 2010-11-22 LAB — URINE MICROSCOPIC-ADD ON

## 2010-11-22 LAB — TSH: TSH: 0.797 u[IU]/mL (ref 0.350–4.500)

## 2010-11-22 LAB — COMPREHENSIVE METABOLIC PANEL
AST: 25 U/L (ref 0–37)
Albumin: 4.3 g/dL (ref 3.5–5.2)
Alkaline Phosphatase: 99 U/L (ref 39–117)
BUN: 8 mg/dL (ref 6–23)
Chloride: 98 mEq/L (ref 96–112)
Potassium: 3.3 mEq/L — ABNORMAL LOW (ref 3.5–5.1)
Total Bilirubin: 0.6 mg/dL (ref 0.3–1.2)

## 2010-11-22 LAB — CK TOTAL AND CKMB (NOT AT ARMC)
CK, MB: 3.4 ng/mL (ref 0.3–4.0)
CK, MB: 3.7 ng/mL (ref 0.3–4.0)
Relative Index: 1.2 (ref 0.0–2.5)
Total CK: 271 U/L — ABNORMAL HIGH (ref 7–232)
Total CK: 310 U/L — ABNORMAL HIGH (ref 7–232)

## 2010-11-22 LAB — URINE CULTURE
Colony Count: NO GROWTH
Culture  Setup Time: 201110030742
Culture: NO GROWTH

## 2010-11-22 LAB — CBC
HCT: 45.6 % (ref 39.0–52.0)
HCT: 48.1 % (ref 39.0–52.0)
Hemoglobin: 16.4 g/dL (ref 13.0–17.0)
MCH: 29.1 pg (ref 26.0–34.0)
MCH: 29.4 pg (ref 26.0–34.0)
MCHC: 34 g/dL (ref 30.0–36.0)
MCHC: 34.1 g/dL (ref 30.0–36.0)
MCV: 85.6 fL (ref 78.0–100.0)
RDW: 14.9 % (ref 11.5–15.5)

## 2010-11-22 LAB — LIPID PANEL
LDL Cholesterol: 128 mg/dL — ABNORMAL HIGH (ref 0–99)
VLDL: 21 mg/dL (ref 0–40)

## 2010-11-22 LAB — MRSA PCR SCREENING: MRSA by PCR: NEGATIVE

## 2010-11-22 LAB — PROTIME-INR: INR: 2.04 — ABNORMAL HIGH (ref 0.00–1.49)

## 2010-12-03 LAB — PROTIME-INR
INR: 1.11 (ref 0.00–1.49)
INR: 1.17 (ref 0.00–1.49)
Prothrombin Time: 14.2 seconds (ref 11.6–15.2)

## 2010-12-03 LAB — COMPREHENSIVE METABOLIC PANEL
ALT: 23 U/L (ref 0–53)
CO2: 25 mEq/L (ref 19–32)
Calcium: 9.2 mg/dL (ref 8.4–10.5)
Chloride: 109 mEq/L (ref 96–112)
GFR calc non Af Amer: >60 mL/min (ref >60)
Glucose, Bld: 103 mg/dL — ABNORMAL HIGH (ref 70–99)
Sodium: 141 mEq/L (ref 135–145)
Total Bilirubin: 0.4 mg/dL (ref 0.3–1.2)

## 2010-12-03 LAB — DIFFERENTIAL
Basophils Absolute: 0.2 10*3/uL — ABNORMAL HIGH (ref 0.0–0.1)
Basophils Relative: 4 % — ABNORMAL HIGH (ref 0–1)
Eosinophils Absolute: 0.2 10*3/uL (ref 0.0–0.7)
Lymphs Abs: 1.7 10*3/uL (ref 0.7–4.0)
Neutrophils Relative %: 47 % (ref 43–77)

## 2010-12-03 LAB — CBC
Hemoglobin: 14.6 g/dL (ref 13.0–17.0)
MCHC: 33.4 g/dL (ref 30.0–36.0)
MCV: 88 fL (ref 78.0–100.0)
RBC: 4.96 MIL/uL (ref 4.22–5.81)
WBC: 4.8 10*3/uL (ref 4.0–10.5)

## 2010-12-10 LAB — DIFFERENTIAL
Basophils Relative: 1 % (ref 0–1)
Eosinophils Absolute: 0.1 10*3/uL (ref 0.0–0.7)
Lymphs Abs: 1.4 10*3/uL (ref 0.7–4.0)
Monocytes Absolute: 0.4 10*3/uL (ref 0.1–1.0)
Monocytes Relative: 7 % (ref 3–12)

## 2010-12-10 LAB — CBC
HCT: 44.6 % (ref 39.0–52.0)
MCHC: 33.9 g/dL (ref 30.0–36.0)
MCV: 87.9 fL (ref 78.0–100.0)
Platelets: 172 10*3/uL (ref 150–400)
RDW: 15.7 % — ABNORMAL HIGH (ref 11.5–15.5)

## 2010-12-10 LAB — CK TOTAL AND CKMB (NOT AT ARMC): Total CK: 212 U/L (ref 7–232)

## 2010-12-10 LAB — URINALYSIS, ROUTINE W REFLEX MICROSCOPIC
Bilirubin Urine: NEGATIVE
Hgb urine dipstick: NEGATIVE
Specific Gravity, Urine: 1.022 (ref 1.005–1.030)
pH: 6 (ref 5.0–8.0)

## 2010-12-10 LAB — HEPATIC FUNCTION PANEL
Alkaline Phosphatase: 108 U/L (ref 39–117)
Bilirubin, Direct: <0.1 mg/dL (ref 0.0–0.3)
Total Bilirubin: 0.5 mg/dL (ref 0.3–1.2)

## 2010-12-10 LAB — BASIC METABOLIC PANEL
BUN: 8 mg/dL (ref 6–23)
CO2: 23 mEq/L (ref 19–32)
Chloride: 106 mEq/L (ref 96–112)
Creatinine, Ser: 1.05 mg/dL (ref 0.4–1.5)
Glucose, Bld: 154 mg/dL — ABNORMAL HIGH (ref 70–99)
Potassium: 3.3 mEq/L — ABNORMAL LOW (ref 3.5–5.1)

## 2010-12-10 LAB — LIPASE, BLOOD: Lipase: 18 U/L (ref 11–59)

## 2010-12-17 LAB — POCT CARDIAC MARKERS
CKMB, poc: <1 ng/mL — ABNORMAL LOW (ref 1.0–8.0)
CKMB, poc: <1 ng/mL — ABNORMAL LOW (ref 1.0–8.0)
Myoglobin, poc: 100 ng/mL (ref 12–200)
Troponin i, poc: <0.05 ng/mL (ref 0.00–0.09)
Troponin i, poc: <0.05 ng/mL (ref 0.00–0.09)

## 2010-12-17 LAB — POCT I-STAT, CHEM 8
BUN: 6 mg/dL (ref 6–23)
Calcium, Ion: 1.13 mmol/L (ref 1.12–1.32)
Calcium, Ion: 1.16 mmol/L (ref 1.12–1.32)
Chloride: 107 meq/L (ref 96–112)
Creatinine, Ser: 0.9 mg/dL (ref 0.4–1.5)
Creatinine, Ser: 1.1 mg/dL (ref 0.4–1.5)
Glucose, Bld: 131 mg/dL — ABNORMAL HIGH (ref 70–99)
Glucose, Bld: 89 mg/dL (ref 70–99)
HCT: 45 % (ref 39.0–52.0)
Hemoglobin: 15.3 g/dL (ref 13.0–17.0)
Hemoglobin: 15.3 g/dL (ref 13.0–17.0)
Potassium: 3.4 meq/L — ABNORMAL LOW (ref 3.5–5.1)
Potassium: 3.7 mEq/L (ref 3.5–5.1)
Sodium: 138 mEq/L (ref 135–145)
TCO2: 20 mmol/L (ref 0–100)
TCO2: 23 mmol/L (ref 0–100)

## 2010-12-17 LAB — CBC
HCT: 42.3 % (ref 39.0–52.0)
HCT: 43.3 % (ref 39.0–52.0)
HCT: 43.8 % (ref 39.0–52.0)
Hemoglobin: 14.2 g/dL (ref 13.0–17.0)
Hemoglobin: 14.4 g/dL (ref 13.0–17.0)
Hemoglobin: 14.7 g/dL (ref 13.0–17.0)
MCHC: 33.2 g/dL (ref 30.0–36.0)
MCHC: 33.5 g/dL (ref 30.0–36.0)
MCV: 85.9 fL (ref 78.0–100.0)
Platelets: 230 10*3/uL (ref 150–400)
RBC: 5.07 MIL/uL (ref 4.22–5.81)
RBC: 5.07 MIL/uL (ref 4.22–5.81)
RDW: 15.9 % — ABNORMAL HIGH (ref 11.5–15.5)
RDW: 15.9 % — ABNORMAL HIGH (ref 11.5–15.5)
RDW: 15.9 % — ABNORMAL HIGH (ref 11.5–15.5)

## 2010-12-17 LAB — DIFFERENTIAL
Basophils Absolute: 0 10*3/uL (ref 0.0–0.1)
Basophils Absolute: 0.1 10*3/uL (ref 0.0–0.1)
Basophils Relative: 1 % (ref 0–1)
Basophils Relative: 1 % (ref 0–1)
Basophils Relative: 2 % — ABNORMAL HIGH (ref 0–1)
Eosinophils Absolute: 0 10*3/uL (ref 0.0–0.7)
Eosinophils Absolute: 0.1 10*3/uL (ref 0.0–0.7)
Eosinophils Absolute: 0.1 10*3/uL (ref 0.0–0.7)
Eosinophils Relative: 1 % (ref 0–5)
Eosinophils Relative: 2 % (ref 0–5)
Lymphocytes Relative: 17 % (ref 12–46)
Monocytes Absolute: 0.7 10*3/uL (ref 0.1–1.0)
Neutro Abs: 3.2 10*3/uL (ref 1.7–7.7)
Neutro Abs: 4.6 10*3/uL (ref 1.7–7.7)
Neutrophils Relative %: 67 % (ref 43–77)

## 2010-12-17 LAB — COMPREHENSIVE METABOLIC PANEL
ALT: 28 U/L (ref 0–53)
Alkaline Phosphatase: 152 U/L — ABNORMAL HIGH (ref 39–117)
BUN: 6 mg/dL (ref 6–23)
CO2: 25 mEq/L (ref 19–32)
GFR calc non Af Amer: >60 mL/min (ref >60)
Glucose, Bld: 127 mg/dL — ABNORMAL HIGH (ref 70–99)
Potassium: 3.6 mEq/L (ref 3.5–5.1)
Sodium: 138 mEq/L (ref 135–145)
Total Bilirubin: 0.3 mg/dL (ref 0.3–1.2)

## 2010-12-17 LAB — URINALYSIS, ROUTINE W REFLEX MICROSCOPIC
Bilirubin Urine: NEGATIVE
Glucose, UA: NEGATIVE mg/dL
Ketones, ur: NEGATIVE mg/dL
Nitrite: NEGATIVE
Specific Gravity, Urine: 1.017 (ref 1.005–1.030)
pH: 7.5 (ref 5.0–8.0)

## 2010-12-17 LAB — BASIC METABOLIC PANEL
CO2: 27 mEq/L (ref 19–32)
GFR calc Af Amer: >60 mL/min (ref >60)
GFR calc non Af Amer: >60 mL/min (ref >60)
Glucose, Bld: 109 mg/dL — ABNORMAL HIGH (ref 70–99)
Potassium: 3.7 mEq/L (ref 3.5–5.1)
Sodium: 142 mEq/L (ref 135–145)

## 2010-12-17 LAB — CULTURE, ROUTINE-ABSCESS: Culture: NO GROWTH

## 2010-12-17 LAB — PROTIME-INR
INR: 2.8 — ABNORMAL HIGH (ref 0.00–1.49)
Prothrombin Time: 31.2 seconds — ABNORMAL HIGH (ref 11.6–15.2)

## 2010-12-17 LAB — LIPASE, BLOOD: Lipase: 14 U/L (ref 11–59)

## 2011-01-25 NOTE — Discharge Summary (Signed)
[place]  Patient:    Alec Brewer, Alec Brewer Visit Number: [id number] MRN: [id number]          Service Type: MED Location: [phone] Attending Physician:  Alfonso Ramus Dictated by:   Hillery Aldo, M.D. Admit Date:  12/22/2001 Discharge Date: 12/30/2001   CC:         [facility]   Discharge Summary  DISCHARGE DIAGNOSES: 1. Pulmonary emboli. 2. Cardiomegaly. 3. History of deep venous thrombosis. 4. Hypoxia. 5. Plantar warts. 6. Psychosis, not otherwise specified. 7. Anxiety.  DISCHARGE MEDICATIONS: 1. Neurontin 800 mg p.o. b.i.d. 2. Hydrochlorothiazide 25 mg p.o. q.d. 3. Warfarin 5 mg p.o. q.d. 4. Geodon 80 mg p.o. b.i.d.  DISCHARGE INSTRUCTIONS:  Patient is to maintain activity as tolerated.  The patient was instructed to eat a consistent amount of green, leafy vegetables and other foods containing vitamin K.  FOLLOW UP APPOINTMENTS:  [facility] on January 01, 2002 at 9:45 a.m.  Follow up at the outpatient clinic on Jan 13, 2002 at 1:30 p.m. with Dr. Darnelle Catalan. Additionally, [facility] will draw blood on January 01, 2002 to monitor patients protime.  CONSULTATIONS:  None.  BRIEF HISTORY OF PRESENT ILLNESS:  The patient is a 50 year old black male with a past medical history of deep venous thrombosis and pulmonary embolism who presented with a two day history of chest pain similar in quality to prior pain he had during his episode of pulmonary embolism.  The patient describes the pain as being intermittent and rated an 8:10 on a pain scale at its worst. The patient also has had accompanying leg cramping, shortness of breath and cough.  The patient denies any hemoptysis.  The patient does have some fever and chills as well as diaphoresis.  The patient states the chest pain does not radiate but is located in the right chest wall area.  The patient has a history of having taken Coumadin for one year after his prior embolic  events and was seen at [facility] emergency department and diagnosed with a recurrent left deep venous thrombosis approximately six months ago.  The patient was lost to follow up after receiving one week of Coumadin therapy.  The patient now presents with worsening chest pain and bilateral calf pain.  The patient states he was unable to come to the hospital or to see a physician secondary to transportation issues when his symptoms first began.  The patient denies any family history of thromboembolic disease.  PHYSICAL EXAMINATION: VITAL SIGNS Temperature 98.6, blood pressure 148/101, pulse 60, respirations 18, oxygen saturation 100% on 2 liters.  GENERAL:  Well-developed, well-nourished black male who is anxious but in no active distress.  HEENT:  Normocephalic, atraumatic. Pupils are equal, round and reactive to light. Extraocular movements intact. Oropharynx is clear.  NECK:  Supple without thyromegaly or lymphadenopathy.  There are no bruits.  CHEST:  Decreased breath sounds but clear bilaterally.  HEART:  Regular rate and rhythm with a prominent S2.  There is a grade 2:6 systolic murmur.  ABDOMEN:  Soft, nontender, nondistended with bowel sounds present X4.  BACK:  Prominent lipoma.  EXTREMITIES:  Plantar warts on ventral surface bilateral feet.  Bilateral calf tenderness.  No clubbing, cyanosis, or edema.  LABORATORY DATA:  Blood gas revealed pH 7.38, pCO2 43.2, pO2 75, oxygen saturation 94% on room air.  His CBC showed white blood cell count 6.8, hemoglobin 15, hematocrit 43.9, platelet count 202K.  There were 70% neutrophils on the  differential.  His protime was 13.7, INR 1.1, PTT 31, antithrombin 3 was 1.5.  His protein-S was 100, (reference range 81 to 180). His lupus anticoagulant was negative.  Homocysteine level was 8.40, (within normal limits).  Chemistries revealed a sodium of 138, potassium 4.0, chloride 107, bicarb 25, glucose 93, BUN 7, creatinine 1.0, calcium  9.4, total protein 6.5, albumin 3.5, AST 21, ALT 28, alkaline phosphatase 101, total bilirubin 0.6.  Cardiac enzymes were negative X1.  B12 level was 790.  Urine drug screen was negative.  ANA was negative.  Anticardiolipin antibody IgA was less than 14, anticardiolipin IgG less than 12 and anticardiolipin IgM less than 11, (negative).  PROCEDURES AND DIAGNOSTIC STUDIES:  CT scan of the chest and lower extremities, December 22, 2001:  Two very small filling defects within the arteries of the right lower lobe, possibly old pulmonary emboli, but an acute very small embolus cannot be ruled out.  There is no evidence of deep venous thrombus in the veins of the lower legs, pelvis or lower abdomen.  Interval clearing of the previous deep venous thrombus.  Chest x-ray on December 22, 2001:  Cardiomegaly.  No active disease.  2D echocardiogram, December 23, 2001:  The left ventricle is mildly dilated. Overall left ventricular systolic function is at the lower limits of normal. Left ventricular ejection fraction estimated to be 50 to 55%.  There was mild diffuse left ventricular hypokinesis.  Left ventricular wall thickness was at the upper limits of normal.  The aortic valve was mildly calcified.  There was mild mitral valvular regurgitation.  The left atrium was mildly dilated.  HOSPITAL COURSE BY PROBLEMS: #1 - CHEST PAIN:  The first set of cardiac enzymes was negative and given the fact the patient had a known history of deep venous thrombosis and pulmonary embolus, it was felt the patients symptoms were most consistent with recurrent pulmonary emboli.  The patient was bolused with 80 mg per kg of heparin and 18 mg per kg per hour of heparin as a continuous infusion.  His coagulation time and platelets were monitored while on heparin therapy.  After 24 hours of heparin, Coumadin was introduced and titrated to a goal INR of 2 to 3.  An extensive hypercoagulability work-up was undertaken and not found  to  be revealing.  Reportedly the patient has a history of being tested for factor V Leiden and this was also found to be negative.  The patients Coumadin became therapeutic on December 30, 2001 at 2.5.  He was therefore discharged home on Coumadin therapy with his home health nurse to draw blood for monitoring his coagulation times and [facility] will resume monitoring and adjustment of his Warfarin therapy.  #2 - HYPOXIA:  The patient was found to have an initial A-a gradient of 31 so he was put on supplemental oxygen.  He maintained his oxygen saturations without any difficulty and the oxygen was quickly titrated off.  At the time of discharge the patient did not need any continued supplemental oxygen therapy.  #3 - PSYCHIATRIC- DEPRESSION, ANXIETY:  We continued the patient on his outpatient regimen of Neurontin.  Additionally, his dosage of Geodon was clarified and restarted on December 22, 2001.  The patient did have several episodes of anxiety and needed some p.r.n. anxiolytics.  He will follow up with mental health on discharge.  DISCHARGE LABORATORY DATA VALUES:  His platelet count was 212K.  Protime was 22.6, INR 2.5, PTT 58. Dictated by:   Hillery Aldo,  M.D. Attending Physician:  Alfonso Ramus DD:  01/19/02 TD:  01/21/02 Job: [phone]

## 2011-01-25 NOTE — H&P (Signed)
NAMEMarland Brewer  IGNATZ, DEIS NO.:  [id number]   MEDICAL RECORD NO.:  [id number]                   PATIENT TYPE:  INP   LOCATION:  4714                                 FACILITY:  [facility]   PHYSICIAN:  Rodolph Bong, M.D.                  DATE OF BIRTH:  1961-08-14   DATE OF ADMISSION:  05/16/2002  DATE OF DISCHARGE:                                HISTORY & PHYSICAL   CHIEF COMPLAINT:  Chest pain.   HISTORY OF PRESENT ILLNESS:  This is a 50 year old African-American male  with a prior history of DVT and pulmonary emboli who presented with chest  pain x two days.  He stated that he was eating and watching TV yesterday  when he began having acute right sided chest pain.  He states that the pain  felt similar to prior pulmonary emboli and it was associated with shortness  of breath.  The pain has been constant since that time and now occurs on  bilateral aspects of his chest.  He does report intermittent leg cramping  and toe swelling for which he had a decrease in his ambulation status.  He  does state the pain worsened today with movement and at that point, he  decided to call EMS for transport to [facility].  He denies any recent  surgeries.  He does report that he is a smoker and has had periods of  relative inactivity.   PAST MEDICAL HISTORY:  1. Pulmonary emboli x 2 diagnosed in April, 2003 and October of 2001.  2. History of DVT diagnosed in October of 2001 and late 2002 at [facility]     Emergency Department.  3. Cardiomegaly.  2-D echo in April 2003 revealed a left ventricular     ejection fraction of 50-65%.  He had mildly dilated left ventricle and     left atrium.  There was mild diffuse left ventricular hypokinesis.  4. Plantar warts.  5. Cytosis.   MEDICINES:  1. Neurontin 800 mg p.o. q.h.s.  2. Trazodone 150 mg p.o. q.h.s.  3. Geodon 80 mg two tabs p.o. q.h.s.  4. Warfarin 7 mg p.o. q.d.  5. Hydrochlorothiazide 25 mg p.o. q.d.   ALLERGIES:   No known drug allergies.   SOCIAL HISTORY:  Mr. Twist is a smoker and uses approximately one pack per  day.  He says he has smoked for about 25 years.  He denies any alcohol use.  He does report some crack cocaine approximately one time a week with the  last episode one week ago.  He does use marijuana about three times per  week.  He is married but has been separated from his wife for about 8 years.  He has three children in total, with two from his wife.   FAMILY HISTORY:  Mom died at the age of 87 with stomach cancer.  His father  died at the age of 63 secondary to gunshot wounds.  His sister currently has  breast carcinoma and is alive and under treatment.  His brother died at the  age of 43 after a bad bicycle accident and apparent surgical complications.   REVIEW OF SYMPTOMS:  Mr. Alec Brewer reports hot and cold flashes, sweats,  decreased appetite, depressed and cranky mood.  He denies any recent  suicidal ideation.  He does report some auditory hallucinations which have  been ongoing for awhile.  He does report some abdominal pain, nausea and  vomiting.   PHYSICAL EXAMINATION:  VITAL SIGNS:  Temperature 98.7, respirations 16,  blood pressure 121/76, pulse 78, O2 sat is 99% on room air.  GENERAL:  He is alert, calm, sitting up in bed, in no acute distress.  HEENT:  PERRLA, tympanic membranes clear bilaterally, positive dental  caries, moist mucous membranes, oropharynx without erythema, exudate.  NECK:  Reveals no lymphadenopathy or thyromegaly.  CARDIOVASCULAR:  Regular rate and rhythm with a positive 1-2 grade systolic  ejection murmur.  PULMONARY:  Clear to auscultation bilaterally with slight decreased breath  sounds.  ABDOMEN:  Positive bowel sounds.  Soft, nontender, nondistended.  BACK:  He does have a prominent lipoma.  EXTREMITIES:  No clubbing, cyanosis or edema.  He does have 2+ dorsalis  pedis bilaterally.  He does also have bilateral plantar warts and surgical   scars over his bilateral MTP joints.  RECTAL:  Normal sphincter tone with minimal stool in the vault.  There were  no masses identified and it was heme negative.   LABORATORY DATA:  White blood count 5.8, hemoglobin 14.5, hematocrit 42.7,  platelets 230.  PT of 18.9, INR of 1.7.  Sodium 142, potassium 4.1, chloride  107, bicarb 31, BUN 12, creatinine 1.3, glucose of 55, calcium of 9.4, total  protein of 6.5, albumin 3.5, AST 18, ALT 22, alk phos 87, T-bili 0.4, CK of  125, MB of 1.1 with a relative index of 0.9, troponin I 0.02.  Prior labs  from April, 2003:  Cardiolipin antibody IgG less than 12, ANA negative,  lupus anticoagulant negative.  Protein S at a value of 100 which is within  normal limits.  Homocysteine of 8.40.  Vitamin B12 790, antithrombin-III of  105.   PROCEDURES:  1. CT of the chest which revealed pulmonary emboli in the descending branch     of the left pulmonary artery without any deep venous thrombosis.  2. EKG showed normal sinus rhythm with no ST segment changes.  There is no     axis deviation and some non-specific T-wave abnormalities.   ASSESSMENT AND PLAN:  1. This is a 50 year old African-American male with history of DVT and     pulmonary emboli who presents with new onset chest pain and shortness of     breath.  The differential diagnosis included myocardial infarction,     pulmonary emboli, pericarditis, myocarditis, musculoskeletal etiology,     pneumonia, esophageal spasm, gastroesophageal reflux disease.  Most     likely etiology is pulmonary emboli secondary to his history and CT     findings.  ____________ for acute pulmonary emboli.  We will admit to a     telemetry bed and begin heparin therapy per pharmacy.  We will continue     his Coumadin therapy and try to obtain appropriate therapeutic levels.     Myocardial infarction is not likely secondary to his normal cardiac  markers.  The hypercoagulable risk factors/laboratory abnormalities are     addressed in prior hospitalizations.  We question the utility of a     [place] filter in this case or either a higher INR role.  2. The patient presents with history worrisome for a hypercoagulable state.     However, prior work-up completed included homocysteine levels, Protein C     and S deficiency, antithrombin III levels and were all normal.  He does     have a couple of risk factors including smoking and periods of decreased     activity.  We will discuss the results with his team.  3. Psychiatric.  He is currently followed by [facility] for a     psychiatric disorder of unknown etiology.  He reports current auditory     hallucinations but denies any visual hallucination or suicidal ideation.     We will continue his current psychiatric medications and monitor him     while in the hospital.                                              Rodolph Bong, M.D.   AK/MEDQ  D:  05/17/2002  T:  05/18/2002  Job:  [id number]
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
## 2011-01-25 NOTE — Op Note (Signed)
   NAMEMarland Kitchen  THADD, APUZZO NO.:  [id number]   MEDICAL RECORD NO.:  [id number]                   PATIENT TYPE:  INP   LOCATION:  4714                                 FACILITY:  [facility]   PHYSICIAN:  Larina Earthly, M.D.                 DATE OF BIRTH:  1960-12-12   DATE OF PROCEDURE:  05/19/2002  DATE OF DISCHARGE:                                 OPERATIVE REPORT   PREOPERATIVE DIAGNOSIS:  Recurrent pulmonary embolus.   POSTOPERATIVE DIAGNOSIS:  Recurrent pulmonary embolus.   PROCEDURE:  Trapeze vena cava filter placement.   SURGEON:  Larina Earthly, M.D.   ANESTHESIA:  1% lidocaine local.   COMPLICATIONS:  None.   DISPOSITION:  To holding area stable.   DESCRIPTION OF PROCEDURE:  The patient was taken to the peripheral vascular  catheterization lab, placed in the supine position, where the area of both  groins was prepped and draped in the usual sterile fashion.  First an  attempt was made at accessing the right common femoral vein.  This was  unsuccessful.  Therefore, attempts were made at accessing the left common  femoral vein, and this was quite easy.  A guidewire was passed up to the  level of the vena cava.  The dilator and flush catheter of the trapeze  filter was positioned at the level of L1-2, and vena cavagram was obtained.  This showed the level of the renal veins bilaterally.  The cava was somewhat  large at this level, and so the catheter was brought back to the L3 level  above the level of the bifurcation of the vena cava.  The cava at this level  was 30 mm in size and therefore was acceptable for the trapeze device.  The  dilator was removed.  The trapeze vena cava filter was loaded into the  sheath, and was deployed at the L3 level.  The sheath was removed and  pressure was held.  The patient was transferred back to the holding area in  stable condition.                                               Larina Earthly, M.D.    TFE/MEDQ  D:  05/21/2002  T:  05/21/2002  Job:  [id number]

## 2011-01-25 NOTE — H&P (Signed)
[place]  Patient:    Alec Brewer, Alec Brewer                           MRN: [id number] Adm. Date:  [id number] Attending:  Rosanne Sack                         History and Physical  IDENTIFYING INFORMATION:  Mr. Alec Brewer is a very nice 50 year old black male. Mr. Alec Brewer has basically been in good health until about one week ago; he developed right leg pain which continued with some swelling primarily in his calf.  He is not aware of any injuries.  He has had no recent long car rides or surgery.  Recently, he has been unable to stand due to the pain.  Then three days ago, he started coughing up blood and also complained of some shortness of breath.  No fever.  He came into the emergency room for evaluation.  He denies any family history of clotting disorder.  He does state that his twin 52 year old brother apparently died after a traumatic injury to his jaw and then apparently had a complication during surgery.  ALLERGIES:  No known drug allergies.  MEDICATIONS:  Neurontin 800 mg two at night to help him with "nerves."  PAST MEDICAL HISTORY:  Remarkable for panic and anxiety disorder.  No history of hypertension, diabetes, coronary artery disease, or cancer.  PAST SURGICAL HISTORY:  He has had surgery on both feet.  SOCIAL HISTORY:  He is divorced.  He is disabled.  He has no children.  He does smoke and does not use alcohol.  FAMILY HISTORY:  Father is deceased of heart disease.  Mother died of breast cancer.  One sister had lupus.  REVIEW OF SYSTEMS:  He has had an occasional mild headache with no change in his vision or hearing.  CARDIOVASCULAR:  Please see HPI.  Denies any melena, no hematochezia, no dysuria.  PHYSICAL EXAMINATION:  VITAL SIGNS:  Temperature 99, blood pressure 157/93, O2 saturation 98%.  SKIN:  Warm and dry.  HEENT:  Pupils are equal, round, and reactive to light.  Fundi normal.  TMs normal.  Oropharynx moist.  NECK:   No JVD or thyromegaly.  LUNGS:  Few crackles at the left base.  HEART:  Regular rate and rhythm without murmurs, rubs, or gallops.  ABDOMEN:  Soft with no hepatosplenomegaly or masses palpated.  RECTAL:  Normal tone.  Prostate normal.  Stool heme negative.  GENITOURINARY:  Genitalia normal; testes normal.  LABORATORY DATA ON ADMISSION:  Chest x-ray:  Left lower lobe atelectasis. Doppler of the lower extremity revealed bilateral DVT in the legs, right greater than left.  The pH was 7.44, pCO2 41, and pO2 75.  Sodium was 135, potassium 4.1, chloride 105, bicarb 30, BUN 9, and creatinine 0.9.  ASSESSMENT:  Likely pulmonary embolus, although will confirm with a spiral CT.  He has bilateral deep venous thrombosis.  Need to make sure that he has a pulmonary embolus to get an idea of how long to treat.  He has minimal risk factors, therefore, will obtain protein C, protein S, and a factor V Leiden level. DD:  04/18/00 TD:  04/19/00 Job: [id number]
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
## 2011-06-07 ENCOUNTER — Inpatient Hospital Stay (INDEPENDENT_AMBULATORY_CARE_PROVIDER_SITE_OTHER)
Admission: RE | Admit: 2011-06-07 | Discharge: 2011-06-07 | Disposition: A | Discharge status: Home / Self Care | Payer: Medicare Other | Source: Ambulatory Visit | Attending: Family Medicine | Admitting: Family Medicine

## 2011-06-07 ENCOUNTER — Emergency Department (HOSPITAL_COMMUNITY): Payer: Medicare Other

## 2011-06-07 ENCOUNTER — Emergency Department (HOSPITAL_COMMUNITY)
Admission: EM | Admit: 2011-06-07 | Discharge: 2011-06-08 | Disposition: A | Discharge status: Home / Self Care | Payer: Medicare Other | Attending: Emergency Medicine | Admitting: Emergency Medicine

## 2011-06-07 DIAGNOSIS — R05 Cough: Secondary | ICD-10-CM | POA: Insufficient documentation

## 2011-06-07 DIAGNOSIS — R6889 Other general symptoms and signs: Secondary | ICD-10-CM | POA: Insufficient documentation

## 2011-06-07 DIAGNOSIS — Z86718 Personal history of other venous thrombosis and embolism: Secondary | ICD-10-CM | POA: Insufficient documentation

## 2011-06-07 DIAGNOSIS — Z79899 Other long term (current) drug therapy: Secondary | ICD-10-CM | POA: Insufficient documentation

## 2011-06-07 DIAGNOSIS — I10 Essential (primary) hypertension: Secondary | ICD-10-CM | POA: Insufficient documentation

## 2011-06-07 DIAGNOSIS — F1210 Cannabis abuse, uncomplicated: Secondary | ICD-10-CM | POA: Insufficient documentation

## 2011-06-07 DIAGNOSIS — R197 Diarrhea, unspecified: Secondary | ICD-10-CM | POA: Insufficient documentation

## 2011-06-07 DIAGNOSIS — R059 Cough, unspecified: Secondary | ICD-10-CM | POA: Insufficient documentation

## 2011-06-07 DIAGNOSIS — IMO0001 Reserved for inherently not codable concepts without codable children: Secondary | ICD-10-CM | POA: Insufficient documentation

## 2011-06-07 DIAGNOSIS — F319 Bipolar disorder, unspecified: Secondary | ICD-10-CM | POA: Insufficient documentation

## 2011-06-07 DIAGNOSIS — R079 Chest pain, unspecified: Secondary | ICD-10-CM

## 2011-06-07 DIAGNOSIS — F17200 Nicotine dependence, unspecified, uncomplicated: Secondary | ICD-10-CM | POA: Insufficient documentation

## 2011-06-07 DIAGNOSIS — F411 Generalized anxiety disorder: Secondary | ICD-10-CM | POA: Insufficient documentation

## 2011-06-07 LAB — POCT I-STAT, CHEM 8
BUN: 16 mg/dL (ref 6–23)
Calcium, Ion: 1.24 mmol/L (ref 1.12–1.32)
Chloride: 99 mEq/L (ref 96–112)
Creatinine, Ser: 1.2 mg/dL (ref 0.50–1.35)
Glucose, Bld: 97 mg/dL (ref 70–99)
HCT: 51 % (ref 39.0–52.0)
Hemoglobin: 17.3 g/dL — ABNORMAL HIGH (ref 13.0–17.0)
Potassium: 3.5 mEq/L (ref 3.5–5.1)
Sodium: 139 mEq/L (ref 135–145)
TCO2: 29 mmol/L (ref 0–100)

## 2011-06-07 LAB — POCT I-STAT TROPONIN I: Troponin i, poc: 0 ng/mL (ref 0.00–0.08)

## 2011-06-07 LAB — CBC
HCT: 46 % (ref 39.0–52.0)
Hemoglobin: 16.3 g/dL (ref 13.0–17.0)
MCH: 30.4 pg (ref 26.0–34.0)
MCHC: 35.4 g/dL (ref 30.0–36.0)
MCV: 85.7 fL (ref 78.0–100.0)
Platelets: 223 10*3/uL (ref 150–400)
RBC: 5.37 MIL/uL (ref 4.22–5.81)
RDW: 14.5 % (ref 11.5–15.5)
WBC: 4.9 10*3/uL (ref 4.0–10.5)

## 2011-06-07 LAB — DIFFERENTIAL
Basophils Absolute: 0 10*3/uL (ref 0.0–0.1)
Eosinophils Relative: 2 % (ref 0–5)
Lymphocytes Relative: 48 % — ABNORMAL HIGH (ref 12–46)
Neutro Abs: 2 10*3/uL (ref 1.7–7.7)

## 2011-06-08 LAB — PROTIME-INR: Prothrombin Time: 25.3 seconds — ABNORMAL HIGH (ref 11.6–15.2)

## 2012-03-16 ENCOUNTER — Encounter (HOSPITAL_COMMUNITY): Payer: Self-pay | Admitting: Emergency Medicine

## 2012-03-16 ENCOUNTER — Emergency Department (HOSPITAL_COMMUNITY): Payer: Medicare Other

## 2012-03-16 ENCOUNTER — Encounter (HOSPITAL_COMMUNITY): Payer: Self-pay | Admitting: *Deleted

## 2012-03-16 ENCOUNTER — Inpatient Hospital Stay (HOSPITAL_COMMUNITY)
Admission: EM | Admit: 2012-03-16 | Discharge: 2012-03-17 | DRG: 392 | Disposition: A | Discharge status: Home / Self Care | Payer: Medicare Other | Attending: Emergency Medicine | Admitting: Emergency Medicine

## 2012-03-16 ENCOUNTER — Emergency Department (INDEPENDENT_AMBULATORY_CARE_PROVIDER_SITE_OTHER)
Admission: EM | Admit: 2012-03-16 | Discharge: 2012-03-16 | Disposition: A | Discharge status: Nursing Home (Includes ICF) | Payer: Medicare Other | Source: Home / Self Care

## 2012-03-16 DIAGNOSIS — R079 Chest pain, unspecified: Secondary | ICD-10-CM

## 2012-03-16 DIAGNOSIS — Z7982 Long term (current) use of aspirin: Secondary | ICD-10-CM

## 2012-03-16 DIAGNOSIS — Z86711 Personal history of pulmonary embolism: Secondary | ICD-10-CM

## 2012-03-16 DIAGNOSIS — Z86718 Personal history of other venous thrombosis and embolism: Secondary | ICD-10-CM

## 2012-03-16 DIAGNOSIS — K5732 Diverticulitis of large intestine without perforation or abscess without bleeding: Principal | ICD-10-CM | POA: Diagnosis present

## 2012-03-16 DIAGNOSIS — I10 Essential (primary) hypertension: Secondary | ICD-10-CM | POA: Diagnosis present

## 2012-03-16 DIAGNOSIS — F17200 Nicotine dependence, unspecified, uncomplicated: Secondary | ICD-10-CM | POA: Diagnosis present

## 2012-03-16 DIAGNOSIS — R109 Unspecified abdominal pain: Secondary | ICD-10-CM | POA: Diagnosis present

## 2012-03-16 HISTORY — DX: Acute embolism and thrombosis of unspecified deep veins of unspecified lower extremity: I82.409

## 2012-03-16 HISTORY — DX: Other pulmonary embolism without acute cor pulmonale: I26.99

## 2012-03-16 HISTORY — DX: Essential (primary) hypertension: I10

## 2012-03-16 LAB — CBC
HCT: 49.6 % (ref 39.0–52.0)
RDW: 14.6 % (ref 11.5–15.5)
WBC: 8 10*3/uL (ref 4.0–10.5)

## 2012-03-16 LAB — DIFFERENTIAL
Basophils Absolute: 0 10*3/uL (ref 0.0–0.1)
Lymphocytes Relative: 30 % (ref 12–46)
Monocytes Absolute: 0.7 10*3/uL (ref 0.1–1.0)
Neutro Abs: 4.9 10*3/uL (ref 1.7–7.7)

## 2012-03-16 LAB — POCT I-STAT, CHEM 8
BUN: 14 mg/dL (ref 6–23)
Calcium, Ion: 1.27 mmol/L — ABNORMAL HIGH (ref 1.12–1.23)
Chloride: 95 mEq/L — ABNORMAL LOW (ref 96–112)
Potassium: 3.8 mEq/L (ref 3.5–5.1)
Sodium: 139 mEq/L (ref 135–145)

## 2012-03-16 LAB — POCT URINALYSIS DIP (DEVICE)
Glucose, UA: NEGATIVE mg/dL
Ketones, ur: NEGATIVE mg/dL
Specific Gravity, Urine: >=1.03 (ref 1.005–1.030)

## 2012-03-16 LAB — LIPASE, BLOOD: Lipase: 33 U/L (ref 11–59)

## 2012-03-16 LAB — TROPONIN I: Troponin I: <0.3 ng/mL (ref <0.30)

## 2012-03-16 MED ORDER — IOHEXOL 300 MG/ML  SOLN
100.0000 mL | Freq: Once | INTRAMUSCULAR | Status: AC | PRN
  Administered 2012-03-16: 100 mL via INTRAVENOUS

## 2012-03-16 MED ORDER — VANCOMYCIN HCL IN DEXTROSE 1-5 GM/200ML-% IV SOLN: 1000.0000 mg | Freq: Once | INTRAVENOUS | Status: DC

## 2012-03-16 MED ORDER — HYDROMORPHONE HCL PF 1 MG/ML IJ SOLN
1.0000 mg | Freq: Once | INTRAMUSCULAR | Status: AC
  Administered 2012-03-16: 1 mg via INTRAVENOUS
  Filled 2012-03-16: qty 1

## 2012-03-16 MED ORDER — ONDANSETRON HCL 4 MG/2ML IJ SOLN
4.0000 mg | Freq: Once | INTRAMUSCULAR | Status: AC
  Administered 2012-03-16: 4 mg via INTRAVENOUS
  Filled 2012-03-16: qty 2

## 2012-03-16 MED ORDER — OXYCODONE-ACETAMINOPHEN 5-325 MG PO TABS: 1.0000 | ORAL_TABLET | ORAL | Status: AC | PRN

## 2012-03-16 MED ORDER — METOCLOPRAMIDE HCL 10 MG PO TABS: 10.0000 mg | ORAL_TABLET | Freq: Four times a day (QID) | ORAL | Status: DC | PRN

## 2012-03-16 MED ORDER — VANCOMYCIN HCL 10 G IV SOLR: 1000.0000 mg | Freq: Once | INTRAVENOUS | Status: DC

## 2012-03-16 MED ORDER — ONDANSETRON HCL 4 MG/2ML IJ SOLN: 4.0000 mg | Freq: Three times a day (TID) | INTRAMUSCULAR | Status: DC | PRN

## 2012-03-16 NOTE — ED Provider Notes (Signed)
Alec Brewer is a 51 y.o. male who presents to Urgent Care today for abdominal and chest pain. Patient noted onset of abdominal pain with vomiting headache constipation 2 weeks ago. He notes that this is worsening. He denies any exacerbating factors. He drinks alcohol rarely and intermittently. His last bowel movement was 3 days ago. He is able to eat but feels like the food tends to get stuck in his chest. Additionally he notes that he is having chest pains off and on for the last 2 days. The pains occur as a bandlike strap across his chest which is sharp and nonradiating. The pains are brief and not exertional. He denies any fevers or chills.   PMH reviewed. Hx DVT, PE with IVC filter. History of lap chole.  History  Substance Use Topics  . Smoking status: Not on file  . Smokeless tobacco: Not on file  . Alcohol Use: Not on file   ROS as above Medications reviewed. No current facility-administered medications for this encounter.   No current outpatient prescriptions on file.    Exam:  BP 131/83  Pulse 68  Temp 98.2 F (36.8 C) (Oral)  Resp 20  SpO2 100% Gen: Well NAD HEENT: EOMI,  MMM Lungs: CTABL Nl WOB Heart: RRR no MRG Abd: NABS, diffuse tenderness with guarding. Max tenderness in URQ. No rebound.  Exts: Non edematous BL  LE, warm and well perfused.   EKG: Sinus rhythm at 69 beats per minute with no ST elevations or depressions. Mild nonspecific changes since the last study in September 2012 with change in V3.  Assessment and Plan: 51 y.o. male with Abdominal and chest pain. Patient has had vague onset of abdominal pain for the last 2 weeks. His abdominal exam is significant for diffuse tenderness worse at the right upper quadrant with involuntary guarding. Additionally he complains of brief intermittent atypical sounding chest pains. He does have a worrisome past medical history however his EKG is mildly changed from previously. I do feel that we are unable to fully work up this  individual at the urgent care Center or transfer to the emergency room for further evaluation and management.     Rodolph Bong, MD 03/16/12 [phone]

## 2012-03-16 NOTE — ED Provider Notes (Signed)
Medical screening examination/treatment/procedure(s) were performed by non-physician practitioner and as supervising physician I was immediately available for consultation/collaboration.  Raynald Blend, MD 03/16/12 2026

## 2012-03-16 NOTE — ED Notes (Signed)
Complaining of stomach and chest pain. States ongoing for the past two weeks. States worse today. Patient thought he had stomach virus

## 2012-03-16 NOTE — ED Provider Notes (Signed)
History     CSN: [id number]  Arrival date & time 03/16/12  1900   First MD Initiated Contact with Patient 03/16/12 2041      Chief Complaint  Patient presents with  . Abdominal Pain    (Consider location/radiation/quality/duration/timing/severity/associated sxs/prior treatment) Patient is a 51 y.o. male presenting with abdominal pain. The history is provided by the patient.  Abdominal Pain The primary symptoms of the illness include abdominal pain.  He states that he has not had a bowel movement in the last 2 days and has been having generalized abdominal pain during that time. Pain is severe and he rates it at 9/10. Nothing makes it better. It is somewhat better after passing some flatus. He has had associated nausea and vomiting. He denies fever, chills, sweats. Denies back pain and denies urinary symptoms. He has also been having intermittent chest pain for the last 2 weeks. Pain is sharp and fleeting lasting less than half a minute. It is across lower chest and is worse when he smokes but not if he just takes a deep breath without smoking. He denied any dyspnea or cough or fever or chills. He went to urgent care where he was evaluated and sent down here for further testing.  Past Medical History  Diagnosis Date  . PE (pulmonary embolism)   . DVT (deep venous thrombosis)   . Hypertension     Past Surgical History  Procedure Date  . Ivc filter   . Cholecystectomy     No family history on file.  History  Substance Use Topics  . Smoking status: Current Everyday Smoker  . Smokeless tobacco: Not on file  . Alcohol Use: 1.2 oz/week    2 Cans of beer per week      Review of Systems  Gastrointestinal: Positive for abdominal pain.  All other systems reviewed and are negative.    Allergies  Review of patient's allergies indicates no known allergies.  Home Medications   Current Outpatient Rx  Name Route Sig Dispense Refill  . ASPIRIN BUFFERED 325 MG PO TABS Oral Take  325 mg by mouth daily as needed. For pain    . CHLORTHALIDONE 25 MG PO TABS Oral Take 25 mg by mouth daily.    Marland Kitchen GABAPENTIN 300 MG PO CAPS Oral Take 300 mg by mouth 2 (two) times daily.    Marland Kitchen LISINOPRIL 20 MG PO TABS Oral Take 20 mg by mouth daily.    . QUETIAPINE FUMARATE ER 400 MG PO TB24 Oral Take 400 mg by mouth at bedtime.    . WARFARIN SODIUM 7.5 MG PO TABS Oral Take 7.5 mg by mouth daily.      BP 130/83  Pulse 90  Temp 98.4 F (36.9 C) (Oral)  Resp 18  SpO2 95%  Physical Exam  Vitals reviewed.  51 year old male is resting comfortably and in no acute distress. Vital signs are normal. Oxygen saturation is 95% which is normal. Head is normocephalic and atraumatic. PERRLA, EOMI. There is no scleral icterus. Oropharynx is clear. Neck is nontender and supple without adenopathy. Lungs are clear without rales, wheezes, rhonchi.  Back is nontender there's no CVA tenderness. Heart has regular rate rhythm without murmur. Abdome extremities have no cyanosis or edema, full range of motion is present. Skin is warm and dry without rash. Neurologic: Mental status is normal, cranial nerves are intact, there are no motor or sensory deficits.n is soft, flat, with mild to moderate tenderness across lower abdomen without rebound or  guarding. Peristalsis is decreased. Rectal shows no impaction and stool is normal color. extremities have no cyanosis or edema, full range of motion is present. Skin is warm and dry without rash. Neurologic: Mental status is normal, cranial nerves are intact, there are no motor or sensory deficits.   ED Course  Procedures (including critical care time)  Results for orders placed during the hospital encounter of 03/16/12  LIPASE, BLOOD      Component Value Range   Lipase 33  11 - 59 U/L  CBC      Component Value Range   WBC 8.0  4.0 - 10.5 K/uL   RBC 5.71  4.22 - 5.81 MIL/uL   Hemoglobin 17.9 (*) 13.0 - 17.0 g/dL   HCT 16.1  09.6 - 04.5 %   MCV 86.9  78.0 - 100.0 fL   MCH  31.3  26.0 - 34.0 pg   MCHC 36.1 (*) 30.0 - 36.0 g/dL   RDW 40.9  81.1 - 91.4 %   Platelets 262  150 - 400 K/uL  DIFFERENTIAL      Component Value Range   Neutrophils Relative 61  43 - 77 %   Neutro Abs 4.9  1.7 - 7.7 K/uL   Lymphocytes Relative 30  12 - 46 %   Lymphs Abs 2.4  0.7 - 4.0 K/uL   Monocytes Relative 8  3 - 12 %   Monocytes Absolute 0.7  0.1 - 1.0 K/uL   Eosinophils Relative 1  0 - 5 %   Eosinophils Absolute 0.0  0.0 - 0.7 K/uL   Basophils Relative 0  0 - 1 %   Basophils Absolute 0.0  0.0 - 0.1 K/uL  POCT I-STAT, CHEM 8      Component Value Range   Sodium 139  135 - 145 mEq/L   Potassium 3.8  3.5 - 5.1 mEq/L   Chloride 95 (*) 96 - 112 mEq/L   BUN 14  6 - 23 mg/dL   Creatinine, Ser 7.82 (*) 0.50 - 1.35 mg/dL   Glucose, Bld 956 (*) 70 - 99 mg/dL   Calcium, Ion 2.13 (*) 1.12 - 1.23 mmol/L   TCO2 29  0 - 100 mmol/L   Hemoglobin 19.0 (*) 13.0 - 17.0 g/dL   HCT 08.6 (*) 57.8 - 46.9 %  POCT URINALYSIS DIP (DEVICE)      Component Value Range   Glucose, UA NEGATIVE  NEGATIVE mg/dL   Bilirubin Urine SMALL (*) NEGATIVE   Ketones, ur NEGATIVE  NEGATIVE mg/dL   Specific Gravity, Urine >=1.030  1.005 - 1.030   Hgb urine dipstick TRACE (*) NEGATIVE   pH 6.0  5.0 - 8.0   Protein, ur 30 (*) NEGATIVE mg/dL   Urobilinogen, UA 0.2  0.0 - 1.0 mg/dL   Nitrite NEGATIVE  NEGATIVE   Leukocytes, UA NEGATIVE  NEGATIVE  TROPONIN I      Component Value Range   Troponin I <0.30  <0.30 ng/mL  D-DIMER, QUANTITATIVE      Component Value Range   D-Dimer, Quant 0.91 (*) 0.00 - 0.48 ug/mL-FEU   Dg Abd 1 View  03/16/2012  *RADIOLOGY REPORT*  Clinical Data: Left abdominal pain, vomiting  ABDOMEN - 1 VIEW  Comparison: 08/24/2009  Findings: Scattered air and stool throughout the bowel without obstruction or dilatation.  No ileus.  Prior cholecystectomy and IVC filter noted.  Mild lumbar degenerative changes.  No acute osseous finding.  No abnormal calcifications.  IMPRESSION: No acute finding.  Nonobstructive bowel gas pattern  Original Report Authenticated By: Judie Petit. Ruel Favors, M.D.     Date: 03/16/2012  Rate: 61  Rhythm: normal sinus rhythm  QRS Axis: left  Intervals: normal  ST/T Wave abnormalities: normal  Conduction Disutrbances:none  Narrative Interpretation: Borderline left axis deviation, poor R-wave progression across the precordium. When compared with ECG of 05/30/2011, no significant changes are seen.  Old EKG Reviewed: unchanged    1. Abdominal pain   2. Chest pain       MDM  Lower abdominal pain which seems most likely to be diverticulitis. I do not see evidence of acute surgical abdomen at this time. Chest pain is most likely functional. Fleeting pains such as this are really significant, however, ECG and troponin will be checked as well as d-dimer. CT scan will be obtained of the abdomen.  D-dimer is come back elevated, so CT angiogram of the chest will also be obtained.  CT angiogram and CT of the abdomen and pelvis are all negative for acute process. He'll be discharged with a prescription for Percocet for pain control and reassured.      Dione Booze, MD 03/16/12 [id number]

## 2012-03-16 NOTE — ED Notes (Signed)
Pt knows we need and urine sample. Urinal left at bedside

## 2012-03-16 NOTE — ED Notes (Signed)
Old and new EKG done

## 2012-03-16 NOTE — ED Notes (Signed)
C/O intermittent generalized abdominal "soreness" x 2 wks with some episodes vomiting "about twice per week".  Denies diarrhea.  Last BM few days ago.  Unsure if fevers - c/o hot/cold flashes.  Also c/o intermittent brief episodes of pains across chest over past 2 days, lasting seconds.  Denies any present.

## 2012-03-16 NOTE — ED Notes (Signed)
PT reports he has not moved bowels in 2 days; nauseated; vomiting x 3. Denies diarrh. All over stomach pain and epigastric pain.

## 2012-05-17 ENCOUNTER — Emergency Department (HOSPITAL_COMMUNITY)
Admission: EM | Admit: 2012-05-17 | Discharge: 2012-05-17 | Disposition: A | Discharge status: Home / Self Care | Payer: Medicare Other | Attending: Emergency Medicine | Admitting: Emergency Medicine

## 2012-05-17 ENCOUNTER — Encounter (HOSPITAL_COMMUNITY): Payer: Self-pay | Admitting: Emergency Medicine

## 2012-05-17 DIAGNOSIS — M79606 Pain in leg, unspecified: Secondary | ICD-10-CM

## 2012-05-17 DIAGNOSIS — Z86711 Personal history of pulmonary embolism: Secondary | ICD-10-CM | POA: Insufficient documentation

## 2012-05-17 DIAGNOSIS — M79609 Pain in unspecified limb: Secondary | ICD-10-CM | POA: Insufficient documentation

## 2012-05-17 DIAGNOSIS — F17200 Nicotine dependence, unspecified, uncomplicated: Secondary | ICD-10-CM | POA: Insufficient documentation

## 2012-05-17 DIAGNOSIS — I10 Essential (primary) hypertension: Secondary | ICD-10-CM | POA: Insufficient documentation

## 2012-05-17 DIAGNOSIS — Z79899 Other long term (current) drug therapy: Secondary | ICD-10-CM | POA: Insufficient documentation

## 2012-05-17 DIAGNOSIS — Z86718 Personal history of other venous thrombosis and embolism: Secondary | ICD-10-CM | POA: Insufficient documentation

## 2012-05-17 DIAGNOSIS — Z7901 Long term (current) use of anticoagulants: Secondary | ICD-10-CM | POA: Insufficient documentation

## 2012-05-17 LAB — PROTIME-INR: Prothrombin Time: 31.4 seconds — ABNORMAL HIGH (ref 11.6–15.2)

## 2012-05-17 LAB — CBC WITH DIFFERENTIAL/PLATELET
Basophils Relative: 1 % (ref 0–1)
Eosinophils Absolute: 0.1 10*3/uL (ref 0.0–0.7)
HCT: 44.3 % (ref 39.0–52.0)
Hemoglobin: 15.2 g/dL (ref 13.0–17.0)
MCH: 30.2 pg (ref 26.0–34.0)
MCHC: 34.3 g/dL (ref 30.0–36.0)
Monocytes Absolute: 0.6 10*3/uL (ref 0.1–1.0)
Monocytes Relative: 12 % (ref 3–12)
RDW: 15.7 % — ABNORMAL HIGH (ref 11.5–15.5)

## 2012-05-17 MED ORDER — OXYCODONE-ACETAMINOPHEN 5-325 MG PO TABS: 1.0000 | ORAL_TABLET | Freq: Four times a day (QID) | ORAL | Status: AC | PRN

## 2012-05-17 NOTE — ED Provider Notes (Signed)
History  Scribed for Geoffery Lyons, MD, the patient was seen in room TR04C/TR04C. This chart was scribed by Candelaria Stagers. The patient's care started at 1:05 PM   CSN: [id number]  Arrival date & time 05/17/12  1123   None     Chief Complaint  Patient presents with  . Leg Pain    The history is provided by the patient. No language interpreter was used.   Alec Brewer is a 51 y.o. male who presents to the Emergency Department complaining of left leg pain down the back of the leg that started several days.  He states that the left leg feels numb.  He is also experiencing lower back pain.  The pain is worse with walking.  He denies any recent injury or new activities.  He has h/o blood clots and a filter in place.  He is currently on coumadin with levels checked about one week ago.  Pt is a former pt of [facility].  Pt reports he had several teeth pulled last week and stopped his coumadin for four days before the visit.   Past Medical History  Diagnosis Date  . PE (pulmonary embolism)   . DVT (deep venous thrombosis)   . Hypertension     Past Surgical History  Procedure Date  . Ivc filter   . Cholecystectomy     No family history on file.  History  Substance Use Topics  . Smoking status: Current Everyday Smoker  . Smokeless tobacco: Not on file  . Alcohol Use: 1.2 oz/week    2 Cans of beer per week      Review of Systems  Musculoskeletal: Positive for back pain (lower back pain) and arthralgias (left leg pain).  All other systems reviewed and are negative.    Allergies  Review of patient's allergies indicates no known allergies.  Home Medications   Current Outpatient Rx  Name Route Sig Dispense Refill  . CHLORTHALIDONE 25 MG PO TABS Oral Take 25 mg by mouth daily.    Marland Kitchen GABAPENTIN 300 MG PO CAPS Oral Take 300 mg by mouth 2 (two) times daily.    . IBUPROFEN 800 MG PO TABS Oral Take 800 mg by mouth 2 (two) times daily as needed. For pain    . LISINOPRIL 20 MG PO  TABS Oral Take 20 mg by mouth daily.    Marland Kitchen METOCLOPRAMIDE HCL 10 MG PO TABS Oral Take 10 mg by mouth every 6 (six) hours as needed. For nausea    . QUETIAPINE FUMARATE ER 400 MG PO TB24 Oral Take 400 mg by mouth at bedtime.    . WARFARIN SODIUM 7.5 MG PO TABS Oral Take 7.5 mg by mouth daily.      BP 130/83  Pulse 71  Temp 98.4 F (36.9 C) (Oral)  Resp 19  SpO2 93%  Physical Exam  Nursing note and vitals reviewed. Constitutional: He is oriented to person, place, and time. He appears well-developed and well-nourished. No distress.  HENT:  Head: Normocephalic and atraumatic.  Eyes: EOM are normal. Pupils are equal, round, and reactive to light.  Neck: Neck supple. No tracheal deviation present.  Pulmonary/Chest: Effort normal. No respiratory distress.  Musculoskeletal: Normal range of motion. He exhibits no edema.  Neurological: He is alert and oriented to person, place, and time.  Skin: Skin is warm and dry.  Psychiatric: He has a normal mood and affect. His behavior is normal.    ED Course  Procedures   DIAGNOSTIC STUDIES: Oxygen  Saturation is 93% on room air, normal by my interpretation.    COORDINATION OF CARE:  13:08 Ordered: Protime-INR; CBC with Differential    Labs Reviewed  PROTIME-INR - Abnormal; Notable for the following:    Prothrombin Time 31.4 (*)     INR 2.97 (*)     All other components within normal limits  CBC WITH DIFFERENTIAL - Abnormal; Notable for the following:    RDW 15.7 (*)     All other components within normal limits   No results found.   No diagnosis found.    MDM  The patient presents with pain to the back of his left leg.  There is no history of injury or trauma.  He does have a history of dvt and is on coumadin.  This was recently held for a dental procedure and this pain started shortly afterward.  I checked an inr and it is therapeutic at 2.9. He inquired about an ultrasound, however I do not believe this would change the management  of his condition.  He may well have developed a dvt while his coumadin was held, but his inr is now therapeutic and also has [place] filter.  He will be discharged to home with pain medication, rest, prn follow up.  I explained all of this to the patient and he understands the rationale for my workup and plan.   I personally performed the services described in this documentation, which was scribed in my presence. The recorded information has been reviewed and considered.            Geoffery Lyons, MD 05/18/12 [phone]

## 2012-05-17 NOTE — ED Notes (Signed)
Pt. Stated, I'm having lt. Leg pain

## 2013-02-11 ENCOUNTER — Encounter (HOSPITAL_COMMUNITY): Payer: Self-pay | Admitting: Emergency Medicine

## 2013-02-11 ENCOUNTER — Emergency Department (INDEPENDENT_AMBULATORY_CARE_PROVIDER_SITE_OTHER)
Admission: EM | Admit: 2013-02-11 | Discharge: 2013-02-11 | Disposition: A | Discharge status: Home / Self Care | Payer: Medicare Other | Source: Home / Self Care | Attending: Emergency Medicine | Admitting: Emergency Medicine

## 2013-02-11 DIAGNOSIS — Z760 Encounter for issue of repeat prescription: Secondary | ICD-10-CM

## 2013-02-11 DIAGNOSIS — I10 Essential (primary) hypertension: Secondary | ICD-10-CM

## 2013-02-11 NOTE — ED Provider Notes (Signed)
History     CSN: [id number]  Arrival date & time 02/11/13  1002   First MD Initiated Contact with Patient 02/11/13 1021      Chief Complaint  Patient presents with  . Medication Refill    (Consider location/radiation/quality/duration/timing/severity/associated sxs/prior treatment) HPI Comments: Patient presents urgent care requesting refills of all his medicines. He described that he went to [facility] and they told him that his refills are not there any longer. Patient describes no new symptomatology. Described that he was seen at a clinic there was closed recently.  The history is provided by the patient.    Past Medical History  Diagnosis Date  . PE (pulmonary embolism)   . DVT (deep venous thrombosis)   . Hypertension     Past Surgical History  Procedure Laterality Date  . Ivc filter    . Cholecystectomy      No family history on file.  History  Substance Use Topics  . Smoking status: Current Every Day Smoker  . Smokeless tobacco: Not on file  . Alcohol Use: 1.2 oz/week    2 Cans of beer per week      Review of Systems  All other systems reviewed and are negative.    Allergies  Review of patient's allergies indicates no known allergies.  Home Medications   Current Outpatient Rx  Name  Route  Sig  Dispense  Refill  . chlorthalidone (HYGROTON) 25 MG tablet   Oral   Take 25 mg by mouth daily.         Marland Kitchen gabapentin (NEURONTIN) 300 MG capsule   Oral   Take 300 mg by mouth 2 (two) times daily.         Marland Kitchen ibuprofen (ADVIL,MOTRIN) 800 MG tablet   Oral   Take 800 mg by mouth 2 (two) times daily as needed. For pain         . lisinopril (PRINIVIL,ZESTRIL) 20 MG tablet   Oral   Take 20 mg by mouth daily.         . metoCLOPramide (REGLAN) 10 MG tablet   Oral   Take 10 mg by mouth every 6 (six) hours as needed. For nausea         . QUEtiapine (SEROQUEL XR) 400 MG 24 hr tablet   Oral   Take 400 mg by mouth at bedtime.         Marland Kitchen warfarin  (COUMADIN) 7.5 MG tablet   Oral   Take 7.5 mg by mouth daily.           BP 136/91  Pulse 78  Temp(Src) 98.4 F (36.9 C) (Oral)  Resp 18  SpO2 96%  Physical Exam  Vitals reviewed. Constitutional: He appears well-developed. No distress.  Psychiatric: He has a normal mood and affect.    ED Course  Procedures (including critical care time)  Labs Reviewed - No data to display No results found.   No diagnosis found.    MDM  Medication refills request.  Patient requesting refills for lisinopril, geodon, warfarin, and hydrochlorothiazide. His prescriptions were initially obtained at a [facility]. Multiple refills on the bottles were noted. Upon further investigation his medicines were transferred to [facility] [address] We called and confirmed.  Patient has also been given information for the community health clinic to establish continuity of care as patient has comorbidities that require continuity of care and INR monitoring.        Jimmie Molly, MD 02/11/13 1037

## 2013-02-11 NOTE — ED Notes (Signed)
Pt is here to have meds refilled... Doesn't have PCP and is in the process of getting a new one Voices no problems at the moment... He is alert and oriented w/no signs of acute distress.

## 2014-06-03 ENCOUNTER — Encounter (HOSPITAL_COMMUNITY): Payer: Self-pay | Admitting: Emergency Medicine

## 2014-06-03 ENCOUNTER — Emergency Department (HOSPITAL_COMMUNITY)
Admission: EM | Admit: 2014-06-03 | Discharge: 2014-06-03 | Disposition: A | Discharge status: Home / Self Care | Payer: Medicare Other | Attending: Emergency Medicine | Admitting: Emergency Medicine

## 2014-06-03 ENCOUNTER — Emergency Department (HOSPITAL_COMMUNITY): Payer: Medicare Other

## 2014-06-03 DIAGNOSIS — Z86711 Personal history of pulmonary embolism: Secondary | ICD-10-CM | POA: Diagnosis not present

## 2014-06-03 DIAGNOSIS — R51 Headache: Secondary | ICD-10-CM | POA: Diagnosis not present

## 2014-06-03 DIAGNOSIS — M5412 Radiculopathy, cervical region: Secondary | ICD-10-CM | POA: Diagnosis not present

## 2014-06-03 DIAGNOSIS — R5383 Other fatigue: Secondary | ICD-10-CM | POA: Diagnosis not present

## 2014-06-03 DIAGNOSIS — I10 Essential (primary) hypertension: Secondary | ICD-10-CM | POA: Insufficient documentation

## 2014-06-03 DIAGNOSIS — Z79899 Other long term (current) drug therapy: Secondary | ICD-10-CM | POA: Diagnosis not present

## 2014-06-03 DIAGNOSIS — M542 Cervicalgia: Secondary | ICD-10-CM | POA: Diagnosis not present

## 2014-06-03 DIAGNOSIS — Z86718 Personal history of other venous thrombosis and embolism: Secondary | ICD-10-CM | POA: Diagnosis not present

## 2014-06-03 DIAGNOSIS — M792 Neuralgia and neuritis, unspecified: Secondary | ICD-10-CM

## 2014-06-03 DIAGNOSIS — Z7901 Long term (current) use of anticoagulants: Secondary | ICD-10-CM | POA: Diagnosis not present

## 2014-06-03 DIAGNOSIS — M79609 Pain in unspecified limb: Secondary | ICD-10-CM | POA: Diagnosis present

## 2014-06-03 DIAGNOSIS — F17200 Nicotine dependence, unspecified, uncomplicated: Secondary | ICD-10-CM | POA: Insufficient documentation

## 2014-06-03 DIAGNOSIS — R5381 Other malaise: Secondary | ICD-10-CM | POA: Diagnosis not present

## 2014-06-03 LAB — PROTIME-INR
INR: 1.64 — ABNORMAL HIGH (ref 0.00–1.49)
Prothrombin Time: 19.4 seconds — ABNORMAL HIGH (ref 11.6–15.2)

## 2014-06-03 MED ORDER — OXYCODONE-ACETAMINOPHEN 5-325 MG PO TABS: 1.0000 | ORAL_TABLET | Freq: Four times a day (QID) | ORAL | Status: DC | PRN

## 2014-06-03 MED ORDER — METHYLPREDNISOLONE (PAK) 4 MG PO TABS: ORAL_TABLET | ORAL | Status: DC

## 2014-06-03 MED ORDER — OXYCODONE-ACETAMINOPHEN 5-325 MG PO TABS
1.0000 | ORAL_TABLET | Freq: Once | ORAL | Status: AC
  Administered 2014-06-03: 1 via ORAL
  Filled 2014-06-03: qty 1

## 2014-06-03 NOTE — ED Notes (Addendum)
Reports pain to right arm that radiates up his arm and into neck. Pain increases with movement of arm and when he turns his head. Denies any injury to arm. Ambulatory at triage. Hx of PE and DVT.

## 2014-06-03 NOTE — ED Notes (Signed)
Pt at xray

## 2014-06-03 NOTE — ED Notes (Signed)
Pt returned from CT and placed back on the monitor.

## 2014-06-03 NOTE — ED Notes (Signed)
MRI called for ETA sts having issue finding out pt.s type of IVC filter. Pt does not know or have card with him. Patient unable to remember year placed sts maybe "ten or so years ago". Dr. Wilkie Aye made aware.

## 2014-06-03 NOTE — Discharge Instructions (Signed)

## 2014-06-03 NOTE — ED Provider Notes (Signed)
CSN: [id number]     Arrival date & time 06/03/14  1036 History   First MD Initiated Contact with Patient 06/03/14 1107     Chief Complaint  Patient presents with  . Arm Pain  . Neck Pain     (Consider location/radiation/quality/duration/timing/severity/associated sxs/prior Treatment) HPI  This is a 56 are old male with a history of DVT who presents with right arm pain and weakness over the last 2-3 days. Patient reports pain in his neck and right shoulder that radiates down his arm. He reports associated weakness secondary to pain. Denies any injury. States that he rates his arm above his head 2 days ago prior to onset of pain. He has tried 800 mg ibuprofen at home without help. The pain is worsened with range of motion of the neck and arm. Patient is right-handed. Denies any weakness, numbness, tingling, difficulty ambulating, or slurred speech.  Past Medical History  Diagnosis Date  . PE (pulmonary embolism)   . DVT (deep venous thrombosis)   . Hypertension    Past Surgical History  Procedure Laterality Date  . Ivc filter    . Cholecystectomy     History reviewed. No pertinent family history. History  Substance Use Topics  . Smoking status: Current Every Day Smoker  . Smokeless tobacco: Not on file  . Alcohol Use: 1.2 oz/week    2 Cans of beer per week    Review of Systems  Constitutional: Negative for fever.  Musculoskeletal: Positive for neck pain.       Right arm pain  Neurological: Positive for weakness and headaches. Negative for dizziness and numbness.  All other systems reviewed and are negative.     Allergies  Review of patient's allergies indicates no known allergies.  Home Medications   Prior to Admission medications   Medication Sig Start Date End Date Taking? Authorizing Provider  chlorthalidone (HYGROTON) 25 MG tablet Take 25 mg by mouth daily.   Yes Historical Provider, MD  cyclobenzaprine (FLEXERIL) 10 MG tablet Take 10 mg by mouth 3 (three) times  daily as needed for muscle spasms.  05/11/14  Yes Historical Provider, MD  gabapentin (NEURONTIN) 300 MG capsule Take 300 mg by mouth 2 (two) times daily.   Yes Historical Provider, MD  hydrochlorothiazide (HYDRODIURIL) 25 MG tablet Take 25 mg by mouth daily.  05/11/14  Yes Historical Provider, MD  ibuprofen (ADVIL,MOTRIN) 800 MG tablet Take 800 mg by mouth 2 (two) times daily as needed (pain).    Yes Historical Provider, MD  lisinopril (PRINIVIL,ZESTRIL) 20 MG tablet Take 20 mg by mouth daily.   Yes Historical Provider, MD  QUEtiapine (SEROQUEL XR) 400 MG 24 hr tablet Take 400 mg by mouth at bedtime.   Yes Historical Provider, MD  warfarin (COUMADIN) 5 MG tablet Take 5 mg by mouth daily.   Yes Historical Provider, MD  ziprasidone (GEODON) 80 MG capsule Take 80 mg by mouth 2 (two) times daily with a meal.  05/18/14  Yes Historical Provider, MD  methylPREDNIsolone (MEDROL DOSPACK) 4 MG tablet follow package directions 06/03/14   Shon Baton, MD  oxyCODONE-acetaminophen (PERCOCET/ROXICET) 5-325 MG per tablet Take 1-2 tablets by mouth every 6 (six) hours as needed for moderate pain or severe pain. 06/03/14   Shon Baton, MD   BP 143/89  Pulse 74  Temp(Src) 98.2 F (36.8 C) (Oral)  Resp 16  SpO2 97% Physical Exam  Nursing note and vitals reviewed. Constitutional: He is oriented to person, place, and time.  He appears well-developed and well-nourished. No distress.  HENT:  Head: Normocephalic and atraumatic.  Eyes: Pupils are equal, round, and reactive to light.  Neck: Normal range of motion. Neck supple.  Tenderness palpation of the mid cervical region without step off or deformity  Cardiovascular: Normal rate, regular rhythm and normal heart sounds.   No murmur heard. Pulmonary/Chest: Effort normal and breath sounds normal. No respiratory distress. He has no wheezes.  Abdominal: Soft. Bowel sounds are normal. There is no tenderness. There is no rebound.  Musculoskeletal:  Decreased  range of motion of the right upper extremity secondary to pain, 2+ DP pulse, no obvious deformity, reproduction of pain with palpation of the right brachial plexus  Lymphadenopathy:    He has no cervical adenopathy.  Neurological: He is alert and oriented to person, place, and time.  4+ out of 5 right-sided grip strength, biceps strength triceps strength, and unable to assess deltoid strength secondary to pain, intact sensation, fluid speech, cranial nerves II through XII intact, ambulates without difficulty  Skin: Skin is warm and dry.  Psychiatric: He has a normal mood and affect.    ED Course  Procedures (including critical care time) Labs Review Labs Reviewed  PROTIME-INR - Abnormal; Notable for the following:    Prothrombin Time 19.4 (*)    INR 1.64 (*)    All other components within normal limits    Imaging Review Dg Cervical Spine Complete  06/03/2014   CLINICAL DATA:  Right arm pain for several days.  No known injury.  EXAM: CERVICAL SPINE  4+ VIEWS  COMPARISON:  06/13/2005.  FINDINGS: No fracture. No spondylolisthesis. Disc spaces are relatively well maintained. Small endplate osteophytes are noted at C4-C5 and C5-C6. There are facet degenerative changes most evident on the right at C3-C4 and on the left at C4-C5.  Oblique views are somewhat suboptimal for evaluation of the neural foramina. There is moderate neural foraminal narrowing suggested on the right at C3-C4 which could potentially affect the exiting C4 nerve root. There is a similar degree of narrowing of the left neural foramen at C3-C4 as well as at C4-C5. The C5-C6 left neural foramen is mild-to-moderately narrowed.  Soft tissues are unremarkable.  IMPRESSION: No fracture or acute finding.  Degenerative changes as detailed.   Electronically Signed   By: Amie Portland M.D.   On: 06/03/2014 11:54   Ct Cervical Spine Wo Contrast  06/03/2014   CLINICAL DATA:  Right arm pain radiating to the neck  EXAM: CT CERVICAL SPINE  WITHOUT CONTRAST  TECHNIQUE: Multidetector CT imaging of the cervical spine was performed without intravenous contrast. Multiplanar CT image reconstructions were also generated.  COMPARISON:  None.  FINDINGS: There is no fracture or malalignment of the cervical spine. Mild reversal of normal cervical lordosis is noted. Imaged paraspinous structures are unremarkable. Lung apices demonstrate some paraseptal emphysema but are clear.  C2-3: The facet joints are autologously fused. There is partial fusion across the disc space posteriorly and to the right. The central canal and foramina appear open.  C3-4: Right worse than left facet arthropathy and uncovertebral disease. The central canal appears open. Marked right foraminal narrowing is present.  C4-5: Left worse than right facet arthropathy is identified and there is uncovertebral disease. The central canal appears open. Moderate to moderately severe foraminal narrowing is worse on the left.  C5-6: Mild facet arthropathy is present and there is a shallow disc bulge. The central canal and foramina appear open.  C6-7: Facet  degenerative change is worse on the left. There is a shallow disc bulge and some uncovertebral disease. Moderate left foraminal narrowing is identified. The central canal and right foramen are open.  C7-T1: There is some facet degenerative change. Minimal disc bulge identified but the central canal and foramina appear open.  IMPRESSION: No acute finding.  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   Electronically Signed   By: Drusilla Kanner M.D.   On: 06/03/2014 14:28     EKG Interpretation None      MDM   Final diagnoses:  Radicular pain in right arm    Patient presents with neck and right arm pain. Atraumatic but does report having with his right arm over his head prior to symptom onset. It is difficult to assess what is true weakness in the right hand or if it is secondary to pain. Patient was  given Percocet. Plain films of the neck show degenerative changes. Given weakness, would like to obtain MRI. Patient has IVC filter which we could not confirm as being MRI compatible. For this reason, CT scan of the spine was obtained and shows degenerative disc disease. Patient is requesting discharge. He reports improvement with heat pack and pain medication. Will place on a Medrol Dosepak for anti-inflammatory effect. We'll also sent home with a short course of pain medication. At this time have low suspicion for acute stroke and for his pain and physical exam are most consistent with radicular pain.   After history, exam, and medical workup I feel the patient has been appropriately medically screened and is safe for discharge home. Pertinent diagnoses were discussed with the patient. Patient was given return precautions.    Shon Baton, MD 06/03/14 [phone]

## 2014-06-10 ENCOUNTER — Ambulatory Visit: Payer: Medicare Other | Attending: Internal Medicine | Admitting: Physical Therapy

## 2014-06-10 DIAGNOSIS — I10 Essential (primary) hypertension: Secondary | ICD-10-CM | POA: Diagnosis not present

## 2014-06-10 DIAGNOSIS — M47892 Other spondylosis, cervical region: Secondary | ICD-10-CM | POA: Diagnosis not present

## 2014-06-10 DIAGNOSIS — Z5189 Encounter for other specified aftercare: Secondary | ICD-10-CM | POA: Insufficient documentation

## 2014-06-10 DIAGNOSIS — M5030 Other cervical disc degeneration, unspecified cervical region: Secondary | ICD-10-CM | POA: Insufficient documentation

## 2014-06-23 ENCOUNTER — Encounter: Payer: Medicare Other | Admitting: Rehabilitation

## 2014-06-27 ENCOUNTER — Encounter (HOSPITAL_COMMUNITY): Payer: Self-pay | Admitting: Emergency Medicine

## 2014-06-27 ENCOUNTER — Encounter: Payer: Medicare Other | Admitting: Physical Therapy

## 2014-06-27 ENCOUNTER — Emergency Department (HOSPITAL_COMMUNITY)
Admission: EM | Admit: 2014-06-27 | Discharge: 2014-06-27 | Disposition: A | Discharge status: Home / Self Care | Payer: Medicare Other | Attending: Emergency Medicine | Admitting: Emergency Medicine

## 2014-06-27 DIAGNOSIS — Y9389 Activity, other specified: Secondary | ICD-10-CM | POA: Insufficient documentation

## 2014-06-27 DIAGNOSIS — Y92010 Kitchen of single-family (private) house as the place of occurrence of the external cause: Secondary | ICD-10-CM | POA: Diagnosis not present

## 2014-06-27 DIAGNOSIS — Z720 Tobacco use: Secondary | ICD-10-CM | POA: Insufficient documentation

## 2014-06-27 DIAGNOSIS — Z79899 Other long term (current) drug therapy: Secondary | ICD-10-CM | POA: Insufficient documentation

## 2014-06-27 DIAGNOSIS — Z23 Encounter for immunization: Secondary | ICD-10-CM | POA: Insufficient documentation

## 2014-06-27 DIAGNOSIS — S61217A Laceration without foreign body of left little finger without damage to nail, initial encounter: Secondary | ICD-10-CM | POA: Diagnosis not present

## 2014-06-27 DIAGNOSIS — Z791 Long term (current) use of non-steroidal anti-inflammatories (NSAID): Secondary | ICD-10-CM | POA: Insufficient documentation

## 2014-06-27 DIAGNOSIS — I10 Essential (primary) hypertension: Secondary | ICD-10-CM | POA: Insufficient documentation

## 2014-06-27 DIAGNOSIS — Z86718 Personal history of other venous thrombosis and embolism: Secondary | ICD-10-CM | POA: Insufficient documentation

## 2014-06-27 DIAGNOSIS — W260XXA Contact with knife, initial encounter: Secondary | ICD-10-CM | POA: Insufficient documentation

## 2014-06-27 DIAGNOSIS — S61219A Laceration without foreign body of unspecified finger without damage to nail, initial encounter: Secondary | ICD-10-CM

## 2014-06-27 DIAGNOSIS — Z7901 Long term (current) use of anticoagulants: Secondary | ICD-10-CM | POA: Insufficient documentation

## 2014-06-27 DIAGNOSIS — Z86711 Personal history of pulmonary embolism: Secondary | ICD-10-CM | POA: Diagnosis not present

## 2014-06-27 DIAGNOSIS — S6992XA Unspecified injury of left wrist, hand and finger(s), initial encounter: Secondary | ICD-10-CM | POA: Diagnosis present

## 2014-06-27 LAB — PROTIME-INR
INR: 1.75 — ABNORMAL HIGH (ref 0.00–1.49)
Prothrombin Time: 20.6 seconds — ABNORMAL HIGH (ref 11.6–15.2)

## 2014-06-27 MED ORDER — CEPHALEXIN 500 MG PO CAPS: 1000.0000 mg | ORAL_CAPSULE | Freq: Two times a day (BID) | ORAL | Status: DC

## 2014-06-27 MED ORDER — TETANUS-DIPHTH-ACELL PERTUSSIS 5-2.5-18.5 LF-MCG/0.5 IM SUSP
0.5000 mL | Freq: Once | INTRAMUSCULAR | Status: AC
  Administered 2014-06-27: 0.5 mL via INTRAMUSCULAR
  Filled 2014-06-27: qty 0.5

## 2014-06-27 NOTE — ED Provider Notes (Signed)
CSN: [id number]     Arrival date & time 06/27/14  [id number] History  This chart was scribed for non-physician practitioner, Fayrene HelperBowie Maxson Oddo, PA-C, working with Samuel JesterKathleen McManus, DO by Charline BillsEssence Howell, ED Scribe. This patient was seen in room TR08C/TR08C and the patient's care was started at 10:07 AM.   No chief complaint on file.  The history is provided by the patient. No language interpreter was used.   HPI Comments: Alec Brewer is a 53 y.o. male who presents to the Emergency Department complaining of laceration to the L little finger sustained yesterday. Pt states that he unintentionally cut his finger yesterday with a kitchen knife. He has tried rinsing his finger with water and Peroxide. Pt is currently on Coumadin. Tetanus out of date.   Past Medical History  Diagnosis Date  . PE (pulmonary embolism)   . DVT (deep venous thrombosis)   . Hypertension    Past Surgical History  Procedure Laterality Date  . Ivc filter    . Cholecystectomy     No family history on file. History  Substance Use Topics  . Smoking status: Current Every Day Smoker  . Smokeless tobacco: Not on file  . Alcohol Use: 1.2 oz/week    2 Cans of beer per week    Review of Systems  Skin: Positive for wound.  Hematological: Bruises/bleeds easily.   Allergies  Review of patient's allergies indicates no known allergies.  Home Medications   Prior to Admission medications   Medication Sig Start Date End Date Taking? Authorizing Provider  chlorthalidone (HYGROTON) 25 MG tablet Take 25 mg by mouth daily.    Historical Provider, MD  cyclobenzaprine (FLEXERIL) 10 MG tablet Take 10 mg by mouth 3 (three) times daily as needed for muscle spasms.  05/11/14   Historical Provider, MD  gabapentin (NEURONTIN) 300 MG capsule Take 300 mg by mouth 2 (two) times daily.    Historical Provider, MD  hydrochlorothiazide (HYDRODIURIL) 25 MG tablet Take 25 mg by mouth daily.  05/11/14   Historical Provider, MD  ibuprofen (ADVIL,MOTRIN) 800 MG  tablet Take 800 mg by mouth 2 (two) times daily as needed (pain).     Historical Provider, MD  lisinopril (PRINIVIL,ZESTRIL) 20 MG tablet Take 20 mg by mouth daily.    Historical Provider, MD  methylPREDNIsolone (MEDROL DOSPACK) 4 MG tablet follow package directions 06/03/14   Shon Batonourtney F Horton, MD  oxyCODONE-acetaminophen (PERCOCET/ROXICET) 5-325 MG per tablet Take 1-2 tablets by mouth every 6 (six) hours as needed for moderate pain or severe pain. 06/03/14   Shon Batonourtney F Horton, MD  QUEtiapine (SEROQUEL XR) 400 MG 24 hr tablet Take 400 mg by mouth at bedtime.    Historical Provider, MD  warfarin (COUMADIN) 5 MG tablet Take 5 mg by mouth daily.    Historical Provider, MD  ziprasidone (GEODON) 80 MG capsule Take 80 mg by mouth 2 (two) times daily with a meal.  05/18/14   Historical Provider, MD   Triage Vitals: 128/78  Pulse 83  Temp(Src) 97.9 F (36.6 C) (Oral)  Resp 18  SpO2 97% Physical Exam  Nursing note and vitals reviewed. Constitutional: He is oriented to person, place, and time. He appears well-developed and well-nourished. No distress.  HENT:  Head: Normocephalic and atraumatic.  Eyes: Conjunctivae and EOM are normal.  Neck: Neck supple. No tracheal deviation present.  Pulmonary/Chest: Effort normal. No respiratory distress.  Musculoskeletal: Normal range of motion.  Neurological: He is alert and oriented to person, place, and time.  Skin:  Skin is warm and dry.  L hand, L pinky finger:  1 cm superficial laceration to pad finger No active bleeding No foreign object No signs of infection No nail involvement   Psychiatric: He has a normal mood and affect. His behavior is normal.   ED Course  Procedures (including critical care time) DIAGNOSTIC STUDIES: Oxygen Saturation is 97% on RA, normal by my interpretation.    COORDINATION OF CARE: 10:10 AM-Discussed wound care with pt at bedside. Pt also received tetanus injection. Pt agreed to plan.   INR is subtherapeutic at 1.75,  recommend double his dose today and tomorrow and to have it recheck. Keflex prescribe to use only if developing finger infection.  wound were dressed.  No active bleeding.    Labs Review Labs Reviewed  PROTIME-INR   Imaging Review No results found.   EKG Interpretation None      MDM   Final diagnoses:  Laceration of finger with delay in treatment, initial encounter    BP 128/78  Pulse 83  Temp(Src) 97.9 F (36.6 C) (Oral)  Resp 18  SpO2 97%   I personally performed the services described in this documentation, which was scribed in my presence. The recorded information has been reviewed and is accurate.    Fayrene Helper, PA-C 06/27/14 1058

## 2014-06-27 NOTE — Discharge Instructions (Signed)
Take keflex antibiotic only if notice signs of infection such as increasing pain, pus drainage, or increase redness.  Take tylenol for pain  Delayed Wound Closure Sometimes, your health care provider will decide to delay closing a wound for several days. This is done when the wound is badly bruised, dirty, or when it has been several hours since the injury happened. By delaying the closure of your wound, the risk of infection is reduced. Wounds that are closed in 3-7 days after being cleaned up and dressed heal just as well as those that are closed right away. HOME CARE INSTRUCTIONS  Rest and elevate the injured area until the pain and swelling are gone.  Have your wound checked as instructed by your health care provider. SEEK MEDICAL CARE IF:  You develop unusual or increased swelling or redness around the wound.  You have increasing pain or tenderness.  There is increasing fluid (drainage) or a bad smelling drainage coming from the wound. Document Released: 08/26/2005 Document Revised: 08/31/2013 Document Reviewed: 02/23/2013 [facility] Patient Information 2015 [place], [place]. This information is not intended to replace advice given to you by your health care provider. Make sure you discuss any questions you have with your health care provider.

## 2014-06-27 NOTE — ED Notes (Signed)
Laceration to left 5th finger around noon yesterday with kitchen knife. States he just put a bandaid on it but woke this am with bleeding. Pt is on COUMADIN for "clots in legs and lungs".

## 2014-06-28 NOTE — ED Provider Notes (Signed)
Medical screening examination/treatment/procedure(s) were performed by non-physician practitioner and as supervising physician I was immediately available for consultation/collaboration.   EKG Interpretation None        Samuel Jester, DO 06/28/14 2317

## 2014-06-29 ENCOUNTER — Encounter: Payer: Medicare Other | Admitting: Rehabilitation

## 2014-11-04 ENCOUNTER — Encounter (HOSPITAL_COMMUNITY)
Admission: EM | Disposition: A | Discharge status: Home / Self Care | Payer: Medicare Other | Source: Home / Self Care | Attending: Internal Medicine

## 2014-11-04 ENCOUNTER — Emergency Department (HOSPITAL_COMMUNITY): Payer: Medicare Other

## 2014-11-04 ENCOUNTER — Inpatient Hospital Stay (HOSPITAL_COMMUNITY)
Admission: EM | Admit: 2014-11-04 | Discharge: 2014-11-06 | DRG: 249 | Disposition: A | Discharge status: Home / Self Care | Payer: Medicare Other | Attending: Internal Medicine | Admitting: Internal Medicine

## 2014-11-04 ENCOUNTER — Encounter (HOSPITAL_COMMUNITY): Payer: Self-pay | Admitting: *Deleted

## 2014-11-04 DIAGNOSIS — Z86711 Personal history of pulmonary embolism: Secondary | ICD-10-CM | POA: Diagnosis present

## 2014-11-04 DIAGNOSIS — I10 Essential (primary) hypertension: Secondary | ICD-10-CM | POA: Diagnosis present

## 2014-11-04 DIAGNOSIS — Z7901 Long term (current) use of anticoagulants: Secondary | ICD-10-CM

## 2014-11-04 DIAGNOSIS — I2102 ST elevation (STEMI) myocardial infarction involving left anterior descending coronary artery: Secondary | ICD-10-CM | POA: Diagnosis present

## 2014-11-04 DIAGNOSIS — I2510 Atherosclerotic heart disease of native coronary artery without angina pectoris: Secondary | ICD-10-CM

## 2014-11-04 DIAGNOSIS — Z86718 Personal history of other venous thrombosis and embolism: Secondary | ICD-10-CM | POA: Diagnosis not present

## 2014-11-04 DIAGNOSIS — R079 Chest pain, unspecified: Secondary | ICD-10-CM | POA: Diagnosis present

## 2014-11-04 DIAGNOSIS — I219 Acute myocardial infarction, unspecified: Secondary | ICD-10-CM | POA: Diagnosis present

## 2014-11-04 DIAGNOSIS — Z9861 Coronary angioplasty status: Secondary | ICD-10-CM

## 2014-11-04 DIAGNOSIS — I214 Non-ST elevation (NSTEMI) myocardial infarction: Principal | ICD-10-CM | POA: Diagnosis present

## 2014-11-04 DIAGNOSIS — E785 Hyperlipidemia, unspecified: Secondary | ICD-10-CM | POA: Diagnosis present

## 2014-11-04 DIAGNOSIS — F17200 Nicotine dependence, unspecified, uncomplicated: Secondary | ICD-10-CM | POA: Diagnosis present

## 2014-11-04 DIAGNOSIS — I255 Ischemic cardiomyopathy: Secondary | ICD-10-CM | POA: Diagnosis present

## 2014-11-04 DIAGNOSIS — I213 ST elevation (STEMI) myocardial infarction of unspecified site: Secondary | ICD-10-CM

## 2014-11-04 DIAGNOSIS — I2109 ST elevation (STEMI) myocardial infarction involving other coronary artery of anterior wall: Secondary | ICD-10-CM

## 2014-11-04 DIAGNOSIS — Z79899 Other long term (current) drug therapy: Secondary | ICD-10-CM

## 2014-11-04 DIAGNOSIS — I82409 Acute embolism and thrombosis of unspecified deep veins of unspecified lower extremity: Secondary | ICD-10-CM

## 2014-11-04 HISTORY — DX: Atherosclerotic heart disease of native coronary artery without angina pectoris: Z98.61

## 2014-11-04 HISTORY — PX: LEFT HEART CATHETERIZATION WITH CORONARY ANGIOGRAM: SHX5451

## 2014-11-04 HISTORY — DX: Nicotine dependence, unspecified, uncomplicated: F17.200

## 2014-11-04 HISTORY — DX: Hyperlipidemia, unspecified: E78.5

## 2014-11-04 HISTORY — DX: Atherosclerotic heart disease of native coronary artery without angina pectoris: I25.10

## 2014-11-04 HISTORY — DX: Long term (current) use of anticoagulants: Z79.01

## 2014-11-04 LAB — BASIC METABOLIC PANEL
Anion gap: 6 (ref 5–15)
BUN: <5 mg/dL — ABNORMAL LOW (ref 6–23)
CHLORIDE: 104 mmol/L (ref 96–112)
CO2: 26 mmol/L (ref 19–32)
Calcium: 9.2 mg/dL (ref 8.4–10.5)
Creatinine, Ser: 0.94 mg/dL (ref 0.50–1.35)
GFR calc Af Amer: >90 mL/min (ref >90)
GFR calc non Af Amer: >90 mL/min (ref >90)
Glucose, Bld: 104 mg/dL — ABNORMAL HIGH (ref 70–99)
Potassium: 3.7 mmol/L (ref 3.5–5.1)
SODIUM: 136 mmol/L (ref 135–145)

## 2014-11-04 LAB — CBC WITH DIFFERENTIAL/PLATELET
Basophils Absolute: 0 10*3/uL (ref 0.0–0.1)
Basophils Relative: 0 % (ref 0–1)
Eosinophils Absolute: 0.1 10*3/uL (ref 0.0–0.7)
Eosinophils Relative: 1 % (ref 0–5)
HCT: 46.8 % (ref 39.0–52.0)
Hemoglobin: 16.2 g/dL (ref 13.0–17.0)
LYMPHS ABS: 2 10*3/uL (ref 0.7–4.0)
LYMPHS PCT: 23 % (ref 12–46)
MCH: 29.7 pg (ref 26.0–34.0)
MCHC: 34.6 g/dL (ref 30.0–36.0)
MCV: 85.7 fL (ref 78.0–100.0)
MONO ABS: 0.7 10*3/uL (ref 0.1–1.0)
MONOS PCT: 8 % (ref 3–12)
Neutro Abs: 6.1 10*3/uL (ref 1.7–7.7)
Neutrophils Relative %: 68 % (ref 43–77)
PLATELETS: 221 10*3/uL (ref 150–400)
RBC: 5.46 MIL/uL (ref 4.22–5.81)
RDW: 14.9 % (ref 11.5–15.5)
WBC: 8.9 10*3/uL (ref 4.0–10.5)

## 2014-11-04 LAB — POCT ACTIVATED CLOTTING TIME: Activated Clotting Time: 577 seconds

## 2014-11-04 LAB — TROPONIN I
Troponin I: 1.82 ng/mL (ref <0.031)
Troponin I: 3.44 ng/mL (ref <0.031)

## 2014-11-04 LAB — MRSA PCR SCREENING: MRSA BY PCR: NEGATIVE

## 2014-11-04 LAB — I-STAT TROPONIN, ED: Troponin i, poc: 1.36 ng/mL (ref 0.00–0.08)

## 2014-11-04 LAB — PROTIME-INR
INR: 1.44 (ref 0.00–1.49)
PROTHROMBIN TIME: 17.7 s — AB (ref 11.6–15.2)

## 2014-11-04 SURGERY — LEFT HEART CATHETERIZATION WITH CORONARY ANGIOGRAM
Anesthesia: LOCAL

## 2014-11-04 MED ORDER — FENTANYL CITRATE 0.05 MG/ML IJ SOLN
INTRAMUSCULAR | Status: AC
  Filled 2014-11-04: qty 2

## 2014-11-04 MED ORDER — VERAPAMIL HCL 2.5 MG/ML IV SOLN
INTRAVENOUS | Status: AC
  Filled 2014-11-04: qty 2

## 2014-11-04 MED ORDER — HYDROCHLOROTHIAZIDE 25 MG PO TABS
25.0000 mg | ORAL_TABLET | Freq: Every day | ORAL | Status: DC
  Administered 2014-11-04 – 2014-11-06 (×3): 25 mg via ORAL
  Filled 2014-11-04 (×3): qty 1

## 2014-11-04 MED ORDER — HEPARIN SODIUM (PORCINE) 1000 UNIT/ML IJ SOLN
INTRAMUSCULAR | Status: AC
  Filled 2014-11-04: qty 1

## 2014-11-04 MED ORDER — CLOPIDOGREL BISULFATE 300 MG PO TABS
ORAL_TABLET | ORAL | Status: AC
  Filled 2014-11-04: qty 2

## 2014-11-04 MED ORDER — WARFARIN - PHARMACIST DOSING INPATIENT: Freq: Every day | Status: DC

## 2014-11-04 MED ORDER — ACETAMINOPHEN 325 MG PO TABS
650.0000 mg | ORAL_TABLET | ORAL | Status: DC | PRN
  Administered 2014-11-05: 650 mg via ORAL
  Filled 2014-11-04: qty 2

## 2014-11-04 MED ORDER — ZIPRASIDONE HCL 80 MG PO CAPS
80.0000 mg | ORAL_CAPSULE | Freq: Two times a day (BID) | ORAL | Status: DC
  Administered 2014-11-04 – 2014-11-06 (×4): 80 mg via ORAL
  Filled 2014-11-04 (×6): qty 1

## 2014-11-04 MED ORDER — METOPROLOL TARTRATE 25 MG/10 ML ORAL SUSPENSION: 25.0000 mg | Freq: Once | ORAL | Status: DC

## 2014-11-04 MED ORDER — ASPIRIN 81 MG PO CHEW
324.0000 mg | CHEWABLE_TABLET | Freq: Once | ORAL | Status: AC
  Administered 2014-11-04: 324 mg via ORAL
  Filled 2014-11-04: qty 4

## 2014-11-04 MED ORDER — ATORVASTATIN CALCIUM 80 MG PO TABS
80.0000 mg | ORAL_TABLET | Freq: Every day | ORAL | Status: DC
  Administered 2014-11-04 – 2014-11-05 (×2): 80 mg via ORAL
  Filled 2014-11-04 (×4): qty 1

## 2014-11-04 MED ORDER — METOPROLOL TARTRATE 25 MG PO TABS
25.0000 mg | ORAL_TABLET | Freq: Two times a day (BID) | ORAL | Status: DC
  Administered 2014-11-04 – 2014-11-05 (×2): 25 mg via ORAL
  Filled 2014-11-04 (×3): qty 1

## 2014-11-04 MED ORDER — GABAPENTIN 300 MG PO CAPS
300.0000 mg | ORAL_CAPSULE | Freq: Two times a day (BID) | ORAL | Status: DC
  Administered 2014-11-04 – 2014-11-06 (×4): 300 mg via ORAL
  Filled 2014-11-04 (×5): qty 1

## 2014-11-04 MED ORDER — BIVALIRUDIN 250 MG IV SOLR
INTRAVENOUS | Status: AC
  Filled 2014-11-04: qty 250

## 2014-11-04 MED ORDER — CYCLOBENZAPRINE HCL 10 MG PO TABS
10.0000 mg | ORAL_TABLET | Freq: Three times a day (TID) | ORAL | Status: DC | PRN
  Administered 2014-11-04 – 2014-11-06 (×3): 10 mg via ORAL
  Filled 2014-11-04 (×3): qty 1

## 2014-11-04 MED ORDER — HEPARIN (PORCINE) IN NACL 2-0.9 UNIT/ML-% IJ SOLN
INTRAMUSCULAR | Status: AC
  Filled 2014-11-04: qty 1000

## 2014-11-04 MED ORDER — ONDANSETRON HCL 4 MG/2ML IJ SOLN
4.0000 mg | Freq: Four times a day (QID) | INTRAMUSCULAR | Status: DC | PRN
  Administered 2014-11-04: 4 mg via INTRAVENOUS

## 2014-11-04 MED ORDER — METOPROLOL TARTRATE 25 MG PO TABS
25.0000 mg | ORAL_TABLET | Freq: Once | ORAL | Status: AC
  Administered 2014-11-04: 25 mg via ORAL
  Filled 2014-11-04: qty 1

## 2014-11-04 MED ORDER — LIDOCAINE HCL (PF) 1 % IJ SOLN
INTRAMUSCULAR | Status: AC
  Filled 2014-11-04: qty 30

## 2014-11-04 MED ORDER — QUETIAPINE FUMARATE ER 400 MG PO TB24
400.0000 mg | ORAL_TABLET | Freq: Every day | ORAL | Status: DC
  Administered 2014-11-04 – 2014-11-05 (×2): 400 mg via ORAL
  Filled 2014-11-04 (×3): qty 1

## 2014-11-04 MED ORDER — CLOPIDOGREL BISULFATE 75 MG PO TABS
75.0000 mg | ORAL_TABLET | Freq: Every day | ORAL | Status: DC
  Administered 2014-11-05 – 2014-11-06 (×2): 75 mg via ORAL
  Filled 2014-11-04 (×3): qty 1

## 2014-11-04 MED ORDER — LISINOPRIL 20 MG PO TABS
20.0000 mg | ORAL_TABLET | Freq: Every day | ORAL | Status: DC
  Administered 2014-11-04 – 2014-11-06 (×3): 20 mg via ORAL
  Filled 2014-11-04 (×3): qty 1

## 2014-11-04 MED ORDER — ACETAMINOPHEN 325 MG PO TABS: 650.0000 mg | ORAL_TABLET | ORAL | Status: DC | PRN

## 2014-11-04 MED ORDER — NITROGLYCERIN 1 MG/10 ML FOR IR/CATH LAB
INTRA_ARTERIAL | Status: AC
  Filled 2014-11-04: qty 10

## 2014-11-04 MED ORDER — SODIUM CHLORIDE 0.9 % IV SOLN: INTRAVENOUS | Status: DC

## 2014-11-04 MED ORDER — ASPIRIN EC 81 MG PO TBEC
81.0000 mg | DELAYED_RELEASE_TABLET | Freq: Every day | ORAL | Status: DC
  Administered 2014-11-05 – 2014-11-06 (×2): 81 mg via ORAL
  Filled 2014-11-04 (×2): qty 1

## 2014-11-04 MED ORDER — WARFARIN SODIUM 10 MG PO TABS
10.0000 mg | ORAL_TABLET | Freq: Once | ORAL | Status: AC
  Administered 2014-11-04: 10 mg via ORAL
  Filled 2014-11-04: qty 1

## 2014-11-04 MED ORDER — SODIUM CHLORIDE 0.9 % IV SOLN
INTRAVENOUS | Status: AC
  Administered 2014-11-04: 17:00:00 via INTRAVENOUS

## 2014-11-04 MED ORDER — MIDAZOLAM HCL 2 MG/2ML IJ SOLN
INTRAMUSCULAR | Status: AC
  Filled 2014-11-04: qty 2

## 2014-11-04 MED ORDER — OXYCODONE-ACETAMINOPHEN 5-325 MG PO TABS: 1.0000 | ORAL_TABLET | Freq: Four times a day (QID) | ORAL | Status: DC | PRN

## 2014-11-04 MED ORDER — NITROGLYCERIN 0.4 MG SL SUBL
0.4000 mg | SUBLINGUAL_TABLET | SUBLINGUAL | Status: DC | PRN
  Filled 2014-11-04: qty 1

## 2014-11-04 MED ORDER — WARFARIN SODIUM 10 MG PO TABS
10.0000 mg | ORAL_TABLET | Freq: Once | ORAL | Status: DC
  Filled 2014-11-04: qty 1

## 2014-11-04 MED ORDER — ONDANSETRON HCL 4 MG/2ML IJ SOLN
4.0000 mg | Freq: Four times a day (QID) | INTRAMUSCULAR | Status: DC | PRN
  Filled 2014-11-04: qty 2

## 2014-11-04 MED ORDER — HEPARIN (PORCINE) IN NACL 100-0.45 UNIT/ML-% IJ SOLN
1800.0000 [IU]/h | INTRAMUSCULAR | Status: DC
  Administered 2014-11-04: 1600 [IU]/h via INTRAVENOUS
  Administered 2014-11-06: 1800 [IU]/h via INTRAVENOUS
  Filled 2014-11-04 (×5): qty 250

## 2014-11-04 NOTE — ED Provider Notes (Signed)
CSN: [id number]     Arrival date & time 11/04/14  1221 History   First MD Initiated Contact with Patient 11/04/14 1230     Chief Complaint  Patient presents with  . Chest Pain  . Leg Pain      Patient is a 54 y.o. male presenting with chest pain and leg pain. The history is provided by the patient.  Chest Pain Pain location:  R chest Pain quality: burning   Pain radiates to:  Does not radiate Pain severity:  Moderate Onset quality:  Gradual Duration:  3 days Timing:  Constant Progression:  Worsening Chronicity:  New Relieved by:  Nothing Worsened by:  Nothing tried Associated symptoms: back pain, numbness and shortness of breath   Associated symptoms: no abdominal pain, no fever and no syncope   Risk factors: prior DVT/PE   Leg Pain Associated symptoms: back pain   Associated symptoms: no fever   Patient presents for multiple complaints: He first reported "pain all over" He reports CP for 3 days constantly He also reports SOB He also reports chronic back pain and reports right LE pain but no trauma He also reports numbness to his right LE No new weakness is reported    Past Medical History  Diagnosis Date  . PE (pulmonary embolism)   . DVT (deep venous thrombosis)   . Hypertension    Past Surgical History  Procedure Laterality Date  . Ivc filter    . Cholecystectomy     History reviewed. No pertinent family history. History  Substance Use Topics  . Smoking status: Current Every Day Smoker  . Smokeless tobacco: Not on file  . Alcohol Use: 1.2 oz/week    2 Cans of beer per week    Review of Systems  Constitutional: Negative for fever.  Respiratory: Positive for shortness of breath.   Cardiovascular: Positive for chest pain. Negative for syncope.  Gastrointestinal: Negative for abdominal pain.  Musculoskeletal: Positive for back pain.  Neurological: Positive for numbness.  All other systems reviewed and are negative.     Allergies  Review of  patient's allergies indicates no known allergies.  Home Medications   Prior to Admission medications   Medication Sig Start Date End Date Taking? Authorizing Provider  cephALEXin (KEFLEX) 500 MG capsule Take 2 capsules (1,000 mg total) by mouth 2 (two) times daily. 06/27/14   Fayrene Helper, PA-C  chlorthalidone (HYGROTON) 25 MG tablet Take 25 mg by mouth daily.    Historical Provider, MD  cyclobenzaprine (FLEXERIL) 10 MG tablet Take 10 mg by mouth 3 (three) times daily as needed for muscle spasms.  05/11/14   Historical Provider, MD  gabapentin (NEURONTIN) 300 MG capsule Take 300 mg by mouth 2 (two) times daily.    Historical Provider, MD  hydrochlorothiazide (HYDRODIURIL) 25 MG tablet Take 25 mg by mouth daily.  05/11/14   Historical Provider, MD  ibuprofen (ADVIL,MOTRIN) 800 MG tablet Take 800 mg by mouth 2 (two) times daily as needed (pain).     Historical Provider, MD  lisinopril (PRINIVIL,ZESTRIL) 20 MG tablet Take 20 mg by mouth daily.    Historical Provider, MD  methylPREDNIsolone (MEDROL DOSPACK) 4 MG tablet follow package directions 06/03/14   Shon Baton, MD  oxyCODONE-acetaminophen (PERCOCET/ROXICET) 5-325 MG per tablet Take 1-2 tablets by mouth every 6 (six) hours as needed for moderate pain or severe pain. 06/03/14   Shon Baton, MD  QUEtiapine (SEROQUEL XR) 400 MG 24 hr tablet Take 400 mg by mouth  at bedtime.    Historical Provider, MD  warfarin (COUMADIN) 5 MG tablet Take 5 mg by mouth daily.    Historical Provider, MD  ziprasidone (GEODON) 80 MG capsule Take 80 mg by mouth 2 (two) times daily with a meal.  05/18/14   Historical Provider, MD   BP 158/103 mmHg  Pulse 74  Temp(Src) 98.1 F (36.7 C) (Oral)  Resp 23  Ht 6' 4.5" (1.943 m)  Wt 240 lb (108.863 kg)  BMI 28.84 kg/m2  SpO2 94% Physical Exam CONSTITUTIONAL: Well developed/well nourished HEAD: Normocephalic/atraumatic EYES: EOMI/PERRL ENMT: Mucous membranes moist NECK: supple no meningeal signs SPINE/BACK:entire  spine nontender CV: S1/S2 noted, no murmurs/rubs/gallops noted LUNGS: Lungs are clear to auscultation bilaterally, no apparent distress ABDOMEN: soft, nontender, no rebound or guarding, bowel sounds noted throughout abdomen GU:no cva tenderness NEURO: Pt is awake/alert/appropriate, moves all extremitiesx4.  No facial droop.   No focal motor deficits noted in his upper/lower extremities (equal power with hip flexion, knee flex/extension, ankle dorsi/plantar flexion in bilateral LE) EXTREMITIES: pulses normal/equalx4, full ROM, no LE edema or tenderness noted.   SKIN: warm, color normal PSYCH: no abnormalities of mood noted, alert and oriented to situation  ED Course  Procedures  CRITICAL CARE Performed by: Joya Gaskins Total critical care time: 35 Critical care time was exclusive of separately billable procedures and treating other patients. Critical care was necessary to treat or prevent imminent or life-threatening deterioration. Critical care was time spent personally by me on the following activities: development of treatment plan with patient and/or surrogate as well as nursing, discussions with consultants, evaluation of patient's response to treatment, examination of patient, obtaining history from patient or surrogate, ordering and performing treatments and interventions, ordering and review of laboratory studies, ordering and review of radiographic studies, pulse oximetry and re-evaluation of patient's condition. PATIENT WITH STEMI, TAKEN TO CATH LAB  1:36 PM Pt with CP for 3 days Troponin elevated Will call cardiology ASA/NTG ordered 1:45 PM Repeat EKG ordered He appears to have worsening EKG changes D/w dr bensimhon.  He has reviewed EKG. Pt likely had MI prior to hospital arrival. Given patient still has pain with worsening EKG, will activate code STEMI Pt is on coumadin INR is pending, heparin deferred  Cardiology at bedside to take patient to cath lab  Labs  Review Labs Reviewed  I-STAT TROPOININ, ED - Abnormal; Notable for the following:    Troponin i, poc 1.36 (*)    All other components within normal limits  CBC WITH DIFFERENTIAL/PLATELET  BASIC METABOLIC PANEL  PROTIME-INR  TROPONIN I    Imaging Review Dg Chest Portable 1 View  11/04/2014   CLINICAL DATA:  Three-day history of chest pain  EXAM: PORTABLE CHEST - 1 VIEW  COMPARISON:  Chest radiograph June 07, 2011 and chest CT March 16, 2012  FINDINGS: The lungs are clear. Heart is upper normal in size with pulmonary vascularity within normal limits. No pneumothorax. No adenopathy.  IMPRESSION: No edema or consolidation.   Electronically Signed   By: Bretta Bang III M.D.   On: 11/04/2014 13:28     EKG Interpretation   Date/Time:  Friday November 04 2014 12:25:14 EST Ventricular Rate:  73 PR Interval:  160 QRS Duration: 90 QT Interval:  378 QTC Calculation: 416 R Axis:   164 Text Interpretation:  Normal sinus rhythm Right axis deviation Anterior  infarct , age undetermined Abnormal ECG biphasic t waves in lateral leads  Confirmed by Bebe Shaggy  MD, Maegan Buller ([id number])  on 11/04/2014 12:30:51 PM     Medications  nitroGLYCERIN (NITROSTAT) SL tablet 0.4 mg (not administered)  0.9 %  sodium chloride infusion (not administered)  aspirin chewable tablet 324 mg (324 mg Oral Given 11/04/14 1343)  nitroGLYCERIN 100 MCG/ML intra-arterial injection (not administered)  lidocaine (PF) (XYLOCAINE) 1 % injection (not administered)  heparin 2-0.9 UNIT/ML-% infusion (not administered)  verapamil (ISOPTIN) 2.5 MG/ML injection (not administered)    EKG Interpretation  Date/Time:  Friday November 04 2014 13:34:58 EST Ventricular Rate:  68 PR Interval:  153 QRS Duration: 96 QT Interval:  393 QTC Calculation: 418 R Axis:   152 Text Interpretation:  Sinus rhythm Anterior infarct, old Nonspecific T abnormalities, lateral leads ST elevation, consider inferior injury Confirmed by Bebe Shaggy  MD,  Jacquelin Krajewski ([id number]) on 11/04/2014 1:43:59 PM        MDM   Final diagnoses:  ST elevation myocardial infarction (STEMI), unspecified artery    Nursing notes including past medical history and social history reviewed and considered in documentation Labs/vital reviewed myself and considered during evaluation xrays/imaging reviewed by myself and considered during evaluation     Joya Gaskins, MD 11/04/14 [phone]

## 2014-11-04 NOTE — Progress Notes (Signed)
Chaplain responded to code stemi page.  Pt was attempting to call wife, and preferred to make call himself.  Transferred to cath lab.  Nurse made contact with a neighbor who is contacting pt's wife.  Page if assistance/support needed when family arrives. Lillie Fragmin Chaplain Intern 11/04/2014 2:21 PM

## 2014-11-04 NOTE — ED Notes (Addendum)
Pt completely undressed, Dr. Donnalee Curry at the bedside. Pt on Zoll pads. All pts belongings sent with pt. Pt denies CP at this time.

## 2014-11-04 NOTE — H&P (Signed)
Patient ID: Alec Brewer MRN: [id number], DOB/AGE: 17-Oct-1960   Admit date: 11/04/2014   Primary Physician: No PCP Per Patient Primary Cardiologist: New  Pt. 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 PE (pulmonary embolism)   . DVT (deep venous thrombosis)   . Hypertension     Past Surgical History  Procedure Laterality Date  . Ivc filter    . 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 According to the patient, he has been compliant with Coumadin for the last 20 years. He also described what appears to be IVC filter placement in the past. He endorsed a history of a "small heart attack", however I am unable to find any such record in our system. It does not appear he had been cathed before. He states the treatment for his small heart attack was placement of IVC filter.  For the past 3 days, patient has been having intermittent burning sensation in the chest and also pain in the right lower calf. The chest discomfort will come and go. Prompting the patient to seek medical attention at [facility] on 11/04/2014. On arrival, he continued to have chest discomfort. Initial laboratory finding shows that his INR is subtherapeutic at 1.44. Creatinine 0.94. Initial troponin elevated at 1.36. Chest x-ray was negative for acute process. ED physician contacted Dr. Gala Romney who reviewed the patient's initial EKG who felt the patient may have had an out of the hospital MI. Code STEMI was initiated.  Home Medications  Prior to Admission medications   Medication Sig Start Date End Date Taking? Authorizing Provider  cephALEXin (KEFLEX) 500 MG capsule Take 2  capsules (1,000 mg total) by mouth 2 (two) times daily. 06/27/14   Fayrene Helper, PA-C  chlorthalidone (HYGROTON) 25 MG tablet Take 25 mg by mouth daily.    Historical Provider, MD  cyclobenzaprine (FLEXERIL) 10 MG tablet Take 10 mg by mouth 3 (three) times daily as needed for muscle spasms.  05/11/14   Historical Provider, MD  gabapentin (NEURONTIN) 300 MG capsule Take 300 mg by mouth 2 (two) times daily.    Historical Provider, MD  hydrochlorothiazide (HYDRODIURIL) 25 MG tablet Take 25 mg by mouth daily.  05/11/14   Historical Provider, MD  ibuprofen (ADVIL,MOTRIN) 800 MG tablet Take 800 mg by mouth 2 (two) times daily as needed (pain).     Historical Provider, MD  lisinopril (PRINIVIL,ZESTRIL) 20 MG tablet Take 20 mg by mouth daily.    Historical Provider, MD  methylPREDNIsolone (MEDROL DOSPACK) 4 MG tablet follow package directions 06/03/14   Shon Baton, MD  oxyCODONE-acetaminophen (PERCOCET/ROXICET) 5-325 MG per tablet Take 1-2 tablets by mouth every 6 (six) hours as needed for moderate pain or severe pain. 06/03/14   Shon Baton, MD  QUEtiapine (SEROQUEL XR) 400 MG 24 hr tablet Take 400 mg by mouth at bedtime.    Historical Provider, MD  warfarin (COUMADIN) 5 MG tablet Take 5 mg by mouth daily.    Historical Provider, MD  ziprasidone (GEODON) 80 MG capsule Take 80 mg by mouth 2 (two) times daily with a meal.  05/18/14   Historical Provider, MD  Family History  History reviewed. No pertinent family history. Unable to collect FHx due to need for emergent cath  Social History  History   Social History  . Marital Status: Legally Separated    Spouse Name: N/A  . Number of Children: N/A  . Years of Education: N/A   Occupational History  . Not on file.   Social History Main Topics  . Smoking status: Current Every Day Smoker  . Smokeless tobacco: Not on file  . Alcohol Use: 1.2 oz/week    2 Cans of beer per week  . Drug Use: No  . Sexual Activity: Not on file   Other Topics  Concern  . Not on file   Social History Narrative     Review of Systems General:  No chills, fever, night sweats or weight changes.  Cardiovascular:  No dyspnea on exertion, edema, orthopnea, palpitations, paroxysmal nocturnal dyspnea. +chest pain, diaphoresis Dermatological: No rash, lesions/masses Respiratory: No cough, dyspnea Urologic: No hematuria, dysuria Abdominal:   No nausea, vomiting, diarrhea, bright red blood per rectum, melena, or hematemesis Neurologic:  No visual changes, wkns, changes in mental status. All other systems reviewed and are otherwise negative except as noted above.  Physical Exam  Blood pressure 139/89, pulse 78, temperature 98.1 F (36.7 C), temperature source Oral, resp. rate 27, height  (1.93 m), weight 240 lb (108.863 kg), SpO2 97 %.  General: Pleasant, NAD Psych: Normal affect. Neuro: Alert and oriented X 3. Moves all extremities spontaneously. HEENT: Normal  Neck: Supple without bruits or JVD. Lungs:  Resp regular and unlabored, CTA. Heart: RRR no s3, s4, or murmurs. Abdomen: Soft, non-tender, non-distended, BS + x 4.  Extremities: No clubbing, cyanosis or edema. DP/PT/Radials 2+ and equal bilaterally.  Labs  Troponin [facility] Test)  Recent Labs  11/04/14 1313  TROPIPOC 1.36*   No results for input(s): CKTOTAL, CKMB, TROPONINI in the last 72 hours. Lab Results  Component Value Date   WBC 8.9 11/04/2014   HGB 16.2 11/04/2014   HCT 46.8 11/04/2014   MCV 85.7 11/04/2014   PLT 221 11/04/2014    Recent Labs Lab 11/04/14 1306  NA 136  K 3.7  CL 104  CO2 26  BUN <5*  CREATININE 0.94  CALCIUM 9.2  GLUCOSE 104*   Lab Results  Component Value Date   CHOL  06/11/2010    191        ATP III CLASSIFICATION:  <200     mg/dL   Desirable  161-096  mg/dL   Borderline High  >=045    mg/dL   High          HDL 42 06/11/2010   LDLCALC * 06/11/2010    128        Total Cholesterol/HDL:CHD Risk Coronary Heart Disease Risk  Table                     Men   Women  1/2 Average Risk   3.4   3.3  Average Risk       5.0   4.4  2 X Average Risk   9.6   7.1  3 X Average Risk  23.4   11.0        Use the calculated Patient Ratio above and the CHD Risk Table to determine the patient's CHD Risk.        ATP III CLASSIFICATION (LDL):  <100     mg/dL   Optimal  409-811  mg/dL   Near or Above                    Optimal  130-159  mg/dL   Borderline  846-962  mg/dL   High  >952     mg/dL   Very High   TRIG 841 06/11/2010   Lab Results  Component Value Date   DDIMER 0.91* 03/16/2012     Radiology/Studies  Dg Chest Portable 1 View  11/04/2014   CLINICAL DATA:  Three-day history of chest pain  EXAM: PORTABLE CHEST - 1 VIEW  COMPARISON:  Chest radiograph June 07, 2011 and chest CT March 16, 2012  FINDINGS: The lungs are clear. Heart is upper normal in size with pulmonary vascularity within normal limits. No pneumothorax. No adenopathy.  IMPRESSION: No edema or consolidation.   Electronically Signed   By: Bretta Bang III M.D.   On: 11/04/2014 13:28    ECG  NSR with anterior q waves and mild ST elevation in inferior and anterolateral leads  Echocardiogram  - Left ventricle: The cavity size was mildly dilated. Systolic  function was normal. The estimated ejection fraction was in the  range of 50% to 55%. - Aortic valve: Moderate regurgitation. - Mitral valve: Mild regurgitation. - Left atrium: The atrium was moderately dilated.    ASSESSMENT AND PLAN  1. CP concerning for STEMI  - urgent catheterization today.   - if cath does not shows culprit lesion, will need CTA of chest later to r/o recurrent PE/DVT given subtherapeutic INR  2. H/o DVT/PE on coumadin  - s/p IVC filter  - INR subtherapeutic  3. HTN   Signed, Azalee Course, [place] 11/04/2014, 1:57 PM   Patient seen and examined with Azalee Course, PA-C. We discussed all aspects of the encounter. I agree with the assessment and plan as stated  above.  History and ECG consistent with out of hospital anterolateral MI 3 days ago. However has ongoing CP with persistent ST elevation suggestive of myocardium still at risk. Code STEMI activated and patient transported emergently to cath lab. Case d/w Dr. Clifton James.  Daniel Bensimhon,MD 2:14 PM

## 2014-11-04 NOTE — Progress Notes (Addendum)
ANTICOAGULATION CONSULT NOTE - Initial Consult  Pharmacy Consult for coumadin Indication: Hx DVT/PE  No Known Allergies  Patient Measurements: Height: 6\' 4"  (193 cm) Weight: 240 lb (108.863 kg) IBW/kg (Calculated) : 86.8 Heparin Dosing Weight: 108kg  Vital Signs: Temp: 98.1 F (36.7 C) (02/26 1225) Temp Source: Oral (02/26 1225) BP: 139/89 mmHg (02/26 1330) Pulse Rate: 21 (02/26 1414)  Labs:  Recent Labs  11/04/14 1306 11/04/14 1328  HGB 16.2  --   HCT 46.8  --   PLT 221  --   LABPROT 17.7*  --   INR 1.44  --   CREATININE 0.94  --   TROPONINI  --  1.82*    Estimated Creatinine Clearance: 121.5 mL/min (by C-G formula based on Cr of 0.94).   Medical History: Past Medical History  Diagnosis Date  . PE (pulmonary embolism)   . DVT (deep venous thrombosis)   . Hypertension     Medications:  Scheduled:   Infusions:  . sodium chloride      Assessment: 54 yo who is s/p PCI for MI. He has a hx PE/DVT and has been on coumadin. His baseline INR was 1.44 today. Coumadin to start tonight per protocol. Not sure why his INR was this subtherapeutic. He got cath today also. Plan is to resume IV heparin 6 hr post sheath.   PTA = Coumadin 7.5mg  qday last dose 2/25  Goal of Therapy:  INR 2-3 Monitor platelets by anticoagulation protocol: Yes   Plan:   Heparin at 1600 units/hr at 2100  Level in AM Coumadin 10mg  PO x1 Daily INR  Ulyses Southward, PharmD Pager: [phone] 11/04/2014 3:54 PM

## 2014-11-04 NOTE — ED Notes (Signed)
Pt checked in as "pain all over" and then reported burning pain to his chest and pain to right hip and leg which he reports feels like a blood clot. ekg done, airway intact.

## 2014-11-04 NOTE — CV Procedure (Addendum)
Cardiac Catheterization Operative Report  Alec Brewer [id number] 2/26/20163:14 PM No PCP Per Patient  Procedure Performed:  1. Left Heart Catheterization 2. Selective Coronary Angiography 3. Left ventricular angiogram 4. PTCA/bare metal stent x 1 proximal LAD  Operator: Verne Carrow, MD  Arterial access site:  Right radial artery.   Indication:  54 yo male with history of HTN, tobacco abuse, DVT/PE on chronic coumadin therapy presented to ED with c/o chest pain/bilateral leg pain. EKG with subtle ST elevations anterolateral leads but not diagnostic of STEMI. Urgent cardiac cath given ongoing chest pain.                              Procedure Details: The risks, benefits, complications, treatment options, and expected outcomes were discussed with the patient. Emergency consent obtained. The patient was further sedated with Versed and Fentanyl. The right wrist was assessed with a modified Allens test which was positive. The right wrist was prepped and draped in a sterile fashion. 1% lidocaine was used for local anesthesia. Using the modified Seldinger access technique, a 5 French sheath was placed in the right radial artery. 3 mg Verapamil was given through the sheath. 4000 units IV heparin was given. Standard diagnostic catheters were used to perform selective coronary angiography. A pigtail catheter was used to perform a left ventricular angiogram. He was found to have stenosis in the proximal vessel with filling defect c/w thrombus/ruptured plaque. I elected to proceed to PCI of the LAD.   PCI Note: He was given a weight based bolus of Angiomax and a drip was started. He was given Plavix 600 mg po x 1. I then engaged the left main with a XB LAD 3.5 guiding catheter. A Cougar IC wire was advanced down the LAD. A 2.5 x 15 mm balloon was used to pre-dilate the stenosis. A 4.0 x 23 mm Vision bare metal stent was deployed in the proximal LAD x 1. The stenosis was taken from 70% down  to 0% and the area of the ruptured plaque just distal to this stenosis was also covered with the stent. Of note, this lesion was treated due to the presence of thrombus with anterior wall motion abnormality, elevated troponin and waxing/waning chest pain. The proximal area was felt to represent a ruptured plaque.  The stent was post-dilated with a 4.0 x 15 mm Goehner balloon.   The sheath was removed from the right radial artery and a Terumo hemostasis band was applied at the arteriotomy site on the right wrist.    There were no immediate complications. The patient was taken to the recovery area in stable condition.   Hemodynamic Findings: Central aortic pressure: 161/94 Left ventricular pressure: 158/11/25  Angiographic Findings:  Left main: No obstructive disease.   Left Anterior Descending Artery: Large caliber vessel that courses to the apex. The proximal vessel has a 70% stenosis followed by an area of filling defect c/w thrombus. The mid vessel has a 40% stenosis. There are three small diagonal branches.   Circumflex Artery: Large caliber vessel with two obtuse marginal branches and no obstructive disease.   Right Coronary Artery: Large dominant vessel with no obstructive disease.   Left Ventricular Angiogram: LVEF=35-40% with severe hypokinesis of the anterior wall and apex.   Impression: 1. Severe single vessel CAD 2. NSTEMI with ongoing chest pain and subtle ST elevation (not diagnostic of STEMI).  3. Moderate segmental LV systolic dysfunction 4.  Successful PTCA/bare metal stent x 1 proximal LAD  5. Ongoing severe leg pain, bilateral with history of DVT/PE on coumadin with sub-therapeutic INR  Recommendations: Will admit to CCU. He will be continued on ASA and Plavix for one month then would continue Plavix alone with coumadin. Will start beta blocker and statin. Ace-inh in am if BP tolerates. Will arrange echo today. Will arrange LE dopplers to exclude acute DVT. Will resume  coumadin tomorrow. Start heparin drip 6 hours post sheath pull.        Complications:  None. The patient tolerated the procedure well.

## 2014-11-05 ENCOUNTER — Encounter (HOSPITAL_COMMUNITY): Payer: Self-pay | Admitting: Cardiovascular Disease

## 2014-11-05 DIAGNOSIS — I10 Essential (primary) hypertension: Secondary | ICD-10-CM

## 2014-11-05 DIAGNOSIS — M79601 Pain in right arm: Secondary | ICD-10-CM

## 2014-11-05 DIAGNOSIS — M79604 Pain in right leg: Secondary | ICD-10-CM

## 2014-11-05 DIAGNOSIS — I2102 ST elevation (STEMI) myocardial infarction involving left anterior descending coronary artery: Secondary | ICD-10-CM

## 2014-11-05 LAB — CBC
HCT: 46.1 % (ref 39.0–52.0)
HEMOGLOBIN: 15.9 g/dL (ref 13.0–17.0)
MCH: 29.9 pg (ref 26.0–34.0)
MCHC: 34.5 g/dL (ref 30.0–36.0)
MCV: 86.8 fL (ref 78.0–100.0)
Platelets: 226 10*3/uL (ref 150–400)
RBC: 5.31 MIL/uL (ref 4.22–5.81)
RDW: 15.2 % (ref 11.5–15.5)
WBC: 8.1 10*3/uL (ref 4.0–10.5)

## 2014-11-05 LAB — LIPID PANEL
CHOL/HDL RATIO: 5.2 ratio
Cholesterol: 177 mg/dL (ref 0–200)
HDL: 34 mg/dL — ABNORMAL LOW (ref >39)
LDL Cholesterol: 118 mg/dL — ABNORMAL HIGH (ref 0–99)
Triglycerides: 127 mg/dL (ref <150)
VLDL: 25 mg/dL (ref 0–40)

## 2014-11-05 LAB — BASIC METABOLIC PANEL
ANION GAP: 7 (ref 5–15)
BUN: <5 mg/dL — ABNORMAL LOW (ref 6–23)
CO2: 30 mmol/L (ref 19–32)
Calcium: 9.4 mg/dL (ref 8.4–10.5)
Chloride: 101 mmol/L (ref 96–112)
Creatinine, Ser: 1.01 mg/dL (ref 0.50–1.35)
GFR calc Af Amer: >90 mL/min (ref >90)
GFR calc non Af Amer: 82 mL/min — ABNORMAL LOW (ref >90)
GLUCOSE: 98 mg/dL (ref 70–99)
Potassium: 3.7 mmol/L (ref 3.5–5.1)
Sodium: 138 mmol/L (ref 135–145)

## 2014-11-05 LAB — HEPARIN LEVEL (UNFRACTIONATED)
HEPARIN UNFRACTIONATED: 0.27 [IU]/mL — AB (ref 0.30–0.70)
HEPARIN UNFRACTIONATED: 0.32 [IU]/mL (ref 0.30–0.70)
HEPARIN UNFRACTIONATED: 0.47 [IU]/mL (ref 0.30–0.70)

## 2014-11-05 LAB — PROTIME-INR
INR: 1.4 (ref 0.00–1.49)
Prothrombin Time: 17.3 seconds — ABNORMAL HIGH (ref 11.6–15.2)

## 2014-11-05 LAB — TROPONIN I: Troponin I: 5.1 ng/mL (ref <0.031)

## 2014-11-05 MED ORDER — CARVEDILOL 6.25 MG PO TABS
6.2500 mg | ORAL_TABLET | Freq: Two times a day (BID) | ORAL | Status: DC
  Administered 2014-11-05 – 2014-11-06 (×2): 6.25 mg via ORAL
  Filled 2014-11-05 (×4): qty 1

## 2014-11-05 MED ORDER — WARFARIN SODIUM 7.5 MG PO TABS
7.5000 mg | ORAL_TABLET | Freq: Once | ORAL | Status: AC
  Administered 2014-11-05: 7.5 mg via ORAL
  Filled 2014-11-05 (×2): qty 1

## 2014-11-05 NOTE — Progress Notes (Signed)
ANTICOAGULATION CONSULT NOTE - Follow Up Consult  Pharmacy Consult for heparin Indication: h/o PE/DVT   Labs:  Recent Labs  11/04/14 1306 11/04/14 1328 11/04/14 1650 11/04/14 2230 11/05/14 0442  HGB 16.2  --   --   --  15.9  HCT 46.8  --   --   --  46.1  PLT 221  --   --   --  226  LABPROT 17.7*  --   --   --  17.3*  INR 1.44  --   --   --  1.40  HEPARINUNFRC  --   --   --   --  0.27*  CREATININE 0.94  --   --   --   --   TROPONINI  --  1.82* 3.44* 5.10*  --      Assessment: 53yo male subtherapeutic on heparin with initial dosing for low INR.  Goal of Therapy:  Heparin level 0.3-0.7 units/ml   Plan:  Will increase heparin gtt by 1-2 units/kg/hr to 1750 units/hr and check level in 6hr.  Vernard Gambles, PharmD, BCPS  11/05/2014,5:54 AM

## 2014-11-05 NOTE — Progress Notes (Signed)
  Echocardiogram 2D Echocardiogram has been performed.  Delcie Roch 11/05/2014, 10:29 AM

## 2014-11-05 NOTE — Progress Notes (Signed)
TR band removed. Site is a level 0.

## 2014-11-05 NOTE — Progress Notes (Addendum)
CARDIAC REHAB PHASE I   PRE:  Rate/Rhythm: 69 sinus rhythm  BP:  Supine:   Sitting: 143/83  Standing:    SaO2: 94% ra  MODE:  Ambulation: 350 ft   POST:  Rate/Rhythem: 76 sinus rhythm  BP:  Supine:   Sitting: 154/86  Standing:    SaO2: 94 % ra  1125-1205 Pt ambulated in hallway x 1 assist, steady gait, asymptomatic.   Education completed including risk factor modification, low fat-low cholesterol diet, exercise, and medication compliance.  Stent card given.    Pt oriented to outpatient cardiac rehab.   Pt unsure if he is interested in outpatient rehab however would like to hear more about it once he is home.  At pt request, referral will be sent to [facility].  Understanding verbalized   Dan Europe

## 2014-11-05 NOTE — Progress Notes (Addendum)
ANTICOAGULATION CONSULT NOTE - Follow Up Consult  Pharmacy Consult for warfarin + heparin Indication: Hx of DVT/PE  No Known Allergies  Patient Measurements: Height: 6\' 4"  (193 cm) Weight: 222 lb 10.6 oz (101 kg) IBW/kg (Calculated) : 86.8 Heparin Dosing Weight: 101kg  Vital Signs: Temp: 98.6 F (37 C) (02/27 0800) Temp Source: Oral (02/27 0800) BP: 113/80 mmHg (02/27 0600) Pulse Rate: 70 (02/27 0600)  Labs:  Recent Labs  11/04/14 1306  11/04/14 1650 11/04/14 2230 11/05/14 0442 11/05/14 1152  HGB 16.2  --   --   --  15.9  --   HCT 46.8  --   --   --  46.1  --   PLT 221  --   --   --  226  --   LABPROT 17.7*  --   --   --  17.3*  --   INR 1.44  --   --   --  1.40  --   HEPARINUNFRC  --   --   --   --  0.27* 0.32  CREATININE 0.94  --   --   --  1.01  --   TROPONINI  --   < > 3.44* 5.10* 6.67*  --   < > = values in this interval not displayed.  Estimated Creatinine Clearance: 102.7 mL/min (by C-G formula based on Cr of 1.01).   Medications:  Infusions:  . sodium chloride    . heparin 1,750 Units/hr (11/05/14 0800)    Assessment: 71 yoM s/p PCI for MI. Also with hx of PE/DVT, on chronic warfarin PTA. Baseline INR was 1.44.  Home dose is 7.5mg  daily. No doses missed, unsure why patient's INR was subtherapeutic. Heparin also on board post sheath removal. INR remains subtherapeutic today at 1.40, however would not expect to see effects of yesterdays warfarin dose yet. HL therapeutic at 0.32. Hgb 15.9, PLTC 226. No bleeding noted.  Goal of Therapy:  INR 2-3 Heparin level 0.3-0.7 units/ml Monitor platelets by anticoagulation protocol: Yes   Plan: Increase Heparin to 1800 units/hr 6hr HL to confirm Coumadin 7.5mg  PO x1 Daily INR Monitor for s/sx of bleeding  Thank you for allowing pharmacy to be part of this patient's care team  Carly M. Sabat, Pharm.D Clinical Pharmacy Resident Pager: [phone] 11/05/2014 .1:34  PM     ==============================   Addendum: - HL 0.47, therapeutic - no bleeding reported   Plan: - Continue heparin gtt at 1800 units/hr - F/U AM labs    Zakee Deerman D. Laney Potash, PharmD, BCPS Pager:  [phone] 11/05/2014, 6:55 PM

## 2014-11-05 NOTE — Progress Notes (Signed)
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 EKG with subtle ST elevations anterolateral leads. Taken for emergent cath on 2/26. LAD with 70% thrombotic lesion. Treated with BMS. EF 35-40%  Feels better this am. No CP. Echo pending. Still c/o R leg pain     Intake/Output Summary (Last 24 hours) at 11/05/14 1112 Last data filed at 11/05/14 1100  Gross per 24 hour  Intake 1753.25 ml  Output   1850 ml  Net -96.75 ml    Current meds: . aspirin EC  81 mg Oral Daily  . atorvastatin  80 mg Oral q1800  . clopidogrel  75 mg Oral Q breakfast  . gabapentin  300 mg Oral BID  . hydrochlorothiazide  25 mg Oral Daily  . lisinopril  20 mg Oral Daily  . metoprolol tartrate  25 mg Oral BID  . QUEtiapine  400 mg Oral QHS  . Warfarin - Pharmacist Dosing Inpatient   Does not apply q1800  . ziprasidone  80 mg Oral BID WC   Infusions: . sodium chloride    . heparin 1,750 Units/hr (11/05/14 0800)     Objective:  Blood pressure 113/80, pulse 70, temperature 98.6 F (37 C), temperature source Oral, resp. rate 17, height  (1.93 m), weight 101 kg (222 lb 10.6 oz), SpO2 95 %. Weight change:   Physical Exam: General:  Well appearing. No resp difficulty HEENT: normal Neck: supple. JVP 6 . Carotids 2+ bilat; no bruits. No lymphadenopathy or thryomegaly appreciated. Cor: PMI nondisplaced. Regular rate & rhythm. No rubs, gallops or murmurs. Lungs: clear Abdomen: soft, nontender, nondistended. No hepatosplenomegaly. No bruits or masses. Good bowel sounds. Extremities: no cyanosis, clubbing, rash, edema. No cordsNeuro: alert & orientedx3, cranial nerves grossly intact. moves all 4 extremities w/o difficulty. Affect pleasant  Telemetry: SR  Lab Results: Basic Metabolic Panel:  Recent Labs Lab 11/04/14 1306 11/05/14 0442  NA 136 138  K 3.7 3.7  CL 104 101  CO2 26 30   GLUCOSE 104* 98  BUN <5* <5*  CREATININE 0.94 1.01  CALCIUM 9.2 9.4   Liver Function Tests: No results for input(s): AST, ALT, ALKPHOS, BILITOT, PROT, ALBUMIN in the last 168 hours. No results for input(s): LIPASE, AMYLASE in the last 168 hours. No results for input(s): AMMONIA in the last 168 hours. CBC:  Recent Labs Lab 11/04/14 1306 11/05/14 0442  WBC 8.9 8.1  NEUTROABS 6.1  --   HGB 16.2 15.9  HCT 46.8 46.1  MCV 85.7 86.8  PLT 221 226   Cardiac Enzymes:  Recent Labs Lab 11/04/14 1328 11/04/14 1650 11/04/14 2230 11/05/14 0442  TROPONINI 1.82* 3.44* 5.10* 6.67*   BNP: Invalid input(s): POCBNP CBG: No results for input(s): GLUCAP in the last 168 hours. Microbiology: Lab Results  Component Value Date   CULT NO GROWTH 06/11/2010   CULT NO GROWTH 3 DAYS 02/19/2009   No results for input(s): CULT, SDES in the last 168 hours.  Imaging: Dg Chest Portable 1 View  11/04/2014   CLINICAL DATA:  Three-day history of chest pain  EXAM: PORTABLE CHEST - 1 VIEW  COMPARISON:  Chest radiograph June 07, 2011 and chest CT March 16, 2012  FINDINGS: The lungs are clear. Heart is upper normal in size with pulmonary vascularity within normal limits. No pneumothorax. No adenopathy.  IMPRESSION: No edema or consolidation.   Electronically Signed  By: Bretta Bang III M.D.   On: 11/04/2014 13:28     ASSESSMENT:  1. Anterolateral STEMI    - s/p PCI of LAD 2/26 2. H/o DVT/PE s/p IVC filter 3. Ischemic CM EF 35-40% by cath  PLAN/DISCUSSION:  I suspect he had out of hospital anterior MI. Cath with residual clot in LAD. Now s/p BMS to LAD. Echo pending.   Doubt recurrent DVT or PE but with leg pain will u/s LEs.  Can go to tele. Resume coumadin. Does not need full heparin bridge but will continue while in house. Will treat with ASA/plavix/warfarin for 1 month then switch to plavix/coumadin. Continue b-blocker/statin. Start low-dose ACE. Transfer to tele. Hopefully home in am.     LOS: 1 day    Arvilla Meres, MD 11/05/2014, 11:12 AM

## 2014-11-06 ENCOUNTER — Encounter (HOSPITAL_COMMUNITY): Payer: Self-pay | Admitting: Cardiology

## 2014-11-06 DIAGNOSIS — F17200 Nicotine dependence, unspecified, uncomplicated: Secondary | ICD-10-CM | POA: Diagnosis present

## 2014-11-06 DIAGNOSIS — I219 Acute myocardial infarction, unspecified: Secondary | ICD-10-CM | POA: Diagnosis present

## 2014-11-06 DIAGNOSIS — E785 Hyperlipidemia, unspecified: Secondary | ICD-10-CM | POA: Diagnosis present

## 2014-11-06 DIAGNOSIS — Z720 Tobacco use: Secondary | ICD-10-CM

## 2014-11-06 DIAGNOSIS — I2510 Atherosclerotic heart disease of native coronary artery without angina pectoris: Secondary | ICD-10-CM

## 2014-11-06 DIAGNOSIS — Z86711 Personal history of pulmonary embolism: Secondary | ICD-10-CM | POA: Diagnosis present

## 2014-11-06 DIAGNOSIS — Z9861 Coronary angioplasty status: Secondary | ICD-10-CM

## 2014-11-06 DIAGNOSIS — I255 Ischemic cardiomyopathy: Secondary | ICD-10-CM

## 2014-11-06 DIAGNOSIS — Z7901 Long term (current) use of anticoagulants: Secondary | ICD-10-CM

## 2014-11-06 DIAGNOSIS — I10 Essential (primary) hypertension: Secondary | ICD-10-CM | POA: Diagnosis present

## 2014-11-06 LAB — CBC
HCT: 48.5 % (ref 39.0–52.0)
Hemoglobin: 17 g/dL (ref 13.0–17.0)
MCH: 30.5 pg (ref 26.0–34.0)
MCHC: 35.1 g/dL (ref 30.0–36.0)
MCV: 87.1 fL (ref 78.0–100.0)
Platelets: 220 10*3/uL (ref 150–400)
RBC: 5.57 MIL/uL (ref 4.22–5.81)
RDW: 15.3 % (ref 11.5–15.5)
WBC: 6 10*3/uL (ref 4.0–10.5)

## 2014-11-06 LAB — PROTIME-INR
INR: 1.45 (ref 0.00–1.49)
PROTHROMBIN TIME: 17.8 s — AB (ref 11.6–15.2)

## 2014-11-06 LAB — TROPONIN I: Troponin I: 6.67 ng/mL (ref <0.031)

## 2014-11-06 LAB — HEPARIN LEVEL (UNFRACTIONATED): Heparin Unfractionated: 0.71 IU/mL — ABNORMAL HIGH (ref 0.30–0.70)

## 2014-11-06 LAB — BASIC METABOLIC PANEL
ANION GAP: 9 (ref 5–15)
BUN: 7 mg/dL (ref 6–23)
CALCIUM: 9.6 mg/dL (ref 8.4–10.5)
CHLORIDE: 101 mmol/L (ref 96–112)
CO2: 26 mmol/L (ref 19–32)
CREATININE: 0.97 mg/dL (ref 0.50–1.35)
GFR calc Af Amer: >90 mL/min (ref >90)
Glucose, Bld: 91 mg/dL (ref 70–99)
POTASSIUM: 3.5 mmol/L (ref 3.5–5.1)
SODIUM: 136 mmol/L (ref 135–145)

## 2014-11-06 MED ORDER — CLOPIDOGREL BISULFATE 75 MG PO TABS: 75.0000 mg | ORAL_TABLET | Freq: Every day | ORAL | Status: AC

## 2014-11-06 MED ORDER — ASPIRIN 81 MG PO TBEC: 81.0000 mg | DELAYED_RELEASE_TABLET | Freq: Every day | ORAL | Status: AC

## 2014-11-06 MED ORDER — WARFARIN SODIUM 10 MG PO TABS: 10.0000 mg | ORAL_TABLET | Freq: Once | ORAL | Status: DC

## 2014-11-06 MED ORDER — ACETAMINOPHEN 325 MG PO TABS: 650.0000 mg | ORAL_TABLET | ORAL | Status: DC | PRN

## 2014-11-06 MED ORDER — CARVEDILOL 6.25 MG PO TABS: 6.2500 mg | ORAL_TABLET | Freq: Two times a day (BID) | ORAL | Status: AC

## 2014-11-06 MED ORDER — WARFARIN SODIUM 7.5 MG PO TABS
7.5000 mg | ORAL_TABLET | Freq: Once | ORAL | Status: AC
  Administered 2014-11-06: 7.5 mg via ORAL
  Filled 2014-11-06: qty 1

## 2014-11-06 MED ORDER — NITROGLYCERIN 0.4 MG/SPRAY TL SOLN: 1.0000 | Status: AC | PRN

## 2014-11-06 MED ORDER — ATORVASTATIN CALCIUM 80 MG PO TABS: 80.0000 mg | ORAL_TABLET | Freq: Every day | ORAL | Status: AC

## 2014-11-06 NOTE — Discharge Instructions (Signed)
Myocardial Infarction A myocardial infarction (MI) is also called a heart attack. It causes damage to the heart that cannot be fixed. An MI often happens when a blood clot or other blockage cuts blood flow to the heart. When this happens, certain areas of the heart begin to die. This is an emergency. HOME CARE  Take medicine as told by your doctor.  Change certain behaviors as told by your doctor. This may include:  Quitting smoking.  Being active.  Keeping a healthy weight.  Eating a heart-healthy diet. Ask your doctor for help with this diet.  Keeping your diabetes under control.  Lessening stress.  Limiting how much alcohol you drink. GET HELP RIGHT AWAY IF:  You have crushing or pressure-like chest pain that spreads to the arms, back, neck, or jaw. Call your local emergency services (911 in [place] Do not drive yourself to the hospital.  You have severe chest pain.  You have shortness of breath during rest, sleep, or with activity.  You have sudden sweating or clammy skin.  You feel sick to your stomach (nauseous) and throw up (vomit).  You suddenly get lightheaded or dizzy.  You feel your heart beating fast or skipping beats. MAKE SURE YOU:   Understand these instructions.  Will watch your condition.  Will get help right away if you are not doing well or get worse. Document Released: 02/25/2012 Document Reviewed: 10/29/2013 [facility] Patient Information 2015 [place], [place]. This information is not intended to replace advice given to you by your health care provider. Make sure you discuss any questions you have with your health care provider.   Radial Site Care Refer to this sheet in the next few weeks. These instructions provide you with information on caring for yourself after your procedure. Your caregiver may also give you more specific instructions. Your treatment has been planned according to current medical practices, but problems sometimes occur. Call your  caregiver if you have any problems or questions after your procedure. HOME CARE INSTRUCTIONS  You may shower the day after the procedure.Remove the bandage (dressing) and gently wash the site with plain soap and water.Gently pat the site dry.  Do not apply powder or lotion to the site.  Do not submerge the affected site in water for 3 to 5 days.  Inspect the site at least twice daily.  Do not flex or bend the affected arm for 24 hours.  No lifting over 5 pounds (2.3 kg) for 5 days after your procedure.  Do not drive home if you are discharged the same day of the procedure. Have someone else drive you.  You may drive 24 hours after the procedure unless otherwise instructed by your caregiver.  Do not operate machinery or power tools for 24 hours.  A responsible adult should be with you for the first 24 hours after you arrive home. What to expect:  Any bruising will usually fade within 1 to 2 weeks.  Blood that collects in the tissue (hematoma) may be painful to the touch. It should usually decrease in size and tenderness within 1 to 2 weeks. SEEK IMMEDIATE MEDICAL CARE IF:  You have unusual pain at the radial site.  You have redness, warmth, swelling, or pain at the radial site.  You have drainage (other than a small amount of blood on the dressing).  You have chills.  You have a fever or persistent symptoms for more than 72 hours.  You have a fever and your symptoms suddenly get worse.  Your arm becomes pale, cool, tingly, or numb.  You have heavy bleeding from the site. Hold pressure on the site. Document Released: 09/28/2010 Document Revised: 11/18/2011 Document Reviewed: 09/28/2010 [facility] Patient Information 2015 [place], [place]. This information is not intended to replace advice given to you by your health care provider. Make sure you discuss any questions you have with your health care provider.  Coronary Angiogram With Stent, Care After Refer to this sheet in  the next few weeks. These instructions provide you with information on caring for yourself after your procedure. Your health care provider may also give you more specific instructions. Your treatment has been planned according to current medical practices, but problems sometimes occur. Call your health care provider if you have any problems or questions after your procedure.  WHAT TO EXPECT AFTER THE PROCEDURE  The insertion site may be tender for a few days after your procedure. HOME CARE INSTRUCTIONS   Take medicines only as directed by your health care provider. Blood thinners may be prescribed after your procedure to improve blood flow through the stent.  Change any bandages (dressings) as directed by your health care provider.   Check your insertion site every day for redness, swelling, or fluid leaking from the insertion.   Do not take baths, swim, or use a hot tub until your health care provider approves. You may shower. Pat the insertion area dry. Do not rub the insertion area with a washcloth or towel.   Eat a heart-healthy diet. This should include plenty of fresh fruits and vegetables. Meat should be lean cuts. Avoid the following types of food:   Food that is high in salt.   Canned or highly processed food.   Food that is high in saturated fat or sugar.   Fried food.   Make any other lifestyle changes recommended by your health care provider. This may include:   Not using any tobacco products including cigarettes, chewing tobacco, or electronic cigarettes.  Managing your weight.   Getting regular exercise.   Managing your blood pressure.   Limiting your alcohol intake.   Managing other health problems, such as diabetes.   If you need an MRI after your heart stent was placed, be sure to tell the health care provider who orders the MRI that you have a heart stent.   Keep all follow-up visits as directed by your health care provider.  SEEK IMMEDIATE  MEDICAL CARE IF:   You develop chest pain, shortness of breath, feel faint, or pass out.  You have bleeding, swelling larger than a walnut, or drainage from the catheter insertion site.  You develop pain, discoloration, coldness, or severe bruising in the leg or arm that held the catheter.  You develop bleeding from any other place such as from the bowels. There may be bright red blood in the urine or stools, or it may appear as black, tarry stools.  You have a fever or chills. MAKE SURE YOU:  Understand these instructions.  Will watch your condition.  Will get help right away if you are not doing well or get worse. Document Released: 03/15/2005 Document Revised: 01/10/2014 Document Reviewed: 01/27/2013 [facility] Patient Information 2015 [place], [place]. This information is not intended to replace advice given to you by your health care provider. Make sure you discuss any questions you have with your health care provider.

## 2014-11-06 NOTE — Progress Notes (Signed)
Subjective:  Feels good. Ready to go he says. No real leg pain this AM. Some lateral distal LE numbness. Chronic  Objective:  Vital Signs in the last 24 hours: Temp:  [98.2 F (36.8 C)-98.6 F (37 C)] 98.2 F (36.8 C) (02/28 0509) Pulse Rate:  [65-75] 73 (02/28 0509) Resp:  [18-21] 18 (02/28 0509) BP: (119-151)/(63-99) 124/89 mmHg (02/28 0509) SpO2:  [96 %-98 %] 96 % (02/28 0509) Weight:  [218 lb (98.884 kg)] 218 lb (98.884 kg) (02/28 0509)  Intake/Output from previous day: 02/27 0701 - 02/28 0700 In: 1930.4 [P.O.:1575; I.V.:355.4] Out: 2575 [Urine:2575]   Physical Exam: General: Well developed, well nourished, in no acute distress. Head:  Normocephalic and atraumatic. Lungs: Clear to auscultation and percussion. Heart: Normal S1 and S2.  No murmur, rubs or gallops.  Abdomen: soft, non-tender, positive bowel sounds. Extremities: No clubbing or cyanosis. No edema. BLE legs appear normal, normal size, no edema, non tender.  Neurologic: Alert and oriented x 3.    Lab Results:  Recent Labs  11/05/14 0442 11/06/14 0622  WBC 8.1 6.0  HGB 15.9 17.0  PLT 226 220    Recent Labs  11/05/14 0442 11/06/14 0622  NA 138 136  K 3.7 3.5  CL 101 101  CO2 30 26  GLUCOSE 98 91  BUN <5* 7  CREATININE 1.01 0.97    Recent Labs  11/04/14 2230 11/05/14 0442  TROPONINI 5.10* 6.67*   Hepatic Function Pa  Recent Labs  11/05/14 0442  CHOL 177     Imaging: Dg Chest Portable 1 View  11/04/2014   CLINICAL DATA:  Three-day history of chest pain  EXAM: PORTABLE CHEST - 1 VIEW  COMPARISON:  Chest radiograph June 07, 2011 and chest CT March 16, 2012  FINDINGS: The lungs are clear. Heart is upper normal in size with pulmonary vascularity within normal limits. No pneumothorax. No adenopathy.  IMPRESSION: No edema or consolidation.   Electronically Signed   By: Bretta Bang III M.D.   On: 11/04/2014 13:28     Telemetry: No adverse rhythm Personally viewed.    Cardiac Studies:  Cath 2/26 - LAD with 70% thrombotic lesion. Treated with BMS. EF 35-40%  Scheduled Meds: . aspirin EC  81 mg Oral Daily  . atorvastatin  80 mg Oral q1800  . carvedilol  6.25 mg Oral BID WC  . clopidogrel  75 mg Oral Q breakfast  . gabapentin  300 mg Oral BID  . hydrochlorothiazide  25 mg Oral Daily  . lisinopril  20 mg Oral Daily  . QUEtiapine  400 mg Oral QHS  . Warfarin - Pharmacist Dosing Inpatient   Does not apply q1800  . ziprasidone  80 mg Oral BID WC   Continuous Infusions: . sodium chloride    . 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 EKG with subtle ST elevations anterolateral leads. Taken for emergent cath on 2/26. LAD with 70% thrombotic lesion. Treated with BMS. EF  35-40%   - No further leg pain, will forgo LE doppler. Low suspicion for thrombus. Ambulating well.   - Will DC home with close follow up.  - Bb, Ace reviewed.   - Triple therapy - Will treat with ASA/plavix/warfarin for 1 month then switch to plavix/coumadin.  - TOC 7 day follow up.   - Allow Gradual INR increase as outpatient, no bridge required.    Alec Brewer 11/06/2014, 8:48 AM

## 2014-11-06 NOTE — Progress Notes (Addendum)
ANTICOAGULATION CONSULT NOTE - Follow Up Consult  Pharmacy Consult for warfarin + heparin Indication: Hx of DVT/PE  No Known Allergies  Patient Measurements: Height: 6\' 4"  (193 cm) Weight: 218 lb (98.884 kg) IBW/kg (Calculated) : 86.8 Heparin Dosing Weight: 101kg  Vital Signs: Temp: 98.2 F (36.8 C) (02/28 0509) Temp Source: Oral (02/28 0509) BP: 124/89 mmHg (02/28 0509) Pulse Rate: 73 (02/28 0509)  Labs:  Recent Labs  11/04/14 1306  11/04/14 1650 11/04/14 2230  11/05/14 0442 11/05/14 1152 11/05/14 1818 11/06/14 0622  HGB 16.2  --   --   --   --  15.9  --   --  17.0  HCT 46.8  --   --   --   --  46.1  --   --  48.5  PLT 221  --   --   --   --  226  --   --  220  LABPROT 17.7*  --   --   --   --  17.3*  --   --  17.8*  INR 1.44  --   --   --   --  1.40  --   --  1.45  HEPARINUNFRC  --   --   --   --   < > 0.27* 0.32 0.47 0.71*  CREATININE 0.94  --   --   --   --  1.01  --   --  0.97  TROPONINI  --   < > 3.44* 5.10*  --  6.67*  --   --   --   < > = values in this interval not displayed.  Estimated Creatinine Clearance: 106.9 mL/min (by C-G formula based on Cr of 0.97).   Medications:  Infusions:  . sodium chloride      Assessment: 65 yoM s/p PCI for MI. Also with hx of PE/DVT, on chronic warfarin PTA. Baseline INR was 1.44.  Home dose is 7.5mg  daily. No doses missed, unsure why patient's INR was subtherapeutic. Heparin also on board post sheath removal. INR remains subtherapeutic today at 1.45 with minimal change after increased dose on admissiont. HL is now slightly above goal at 0.71. Patient is to be discharged today without bridging. H/H remain wnl and stable with no bleeding noted.  Goal of Therapy:  INR 2-3 Heparin level 0.3-0.7 units/ml Monitor platelets by anticoagulation protocol: Yes   Plan: D/c heparin per MD Coumadin 7.5 mg PO x 1 prior to d/c then continue home dose of 7.5 mg PO daily  Recommend outpatient INR check on Wednesday or Thursday of  this week  Maciel Kegg K. Bonnye Fava, PharmD Clinical Pharmacist - Resident Pager: [phone] Pharmacy: [phone] 11/06/2014 9:10 AM   ADDENDUM: - When speaking to patient about discharge dose, he seemed confused about what he was taking - He stated that he takes 7.5 mg daily but when asked about home tablet strength, he stated that he has 5 mg tablets and only takes one tablet daily - Already received boosted dose today, but will recommend he continue 5 mg daily upon discharge with close INR monitoring  Elexius Minar K. Bonnye Fava, PharmD Clinical Pharmacist - Resident Pager: [phone] Pharmacy: [phone] 11/06/2014 9:21 AM

## 2014-11-07 MED FILL — Sodium Chloride IV Soln 0.9%: INTRAVENOUS | Qty: 50 | Status: AC

## 2014-11-08 ENCOUNTER — Telehealth: Payer: Self-pay | Admitting: Physician Assistant

## 2014-11-08 NOTE — Telephone Encounter (Signed)
New message      TCM appt on 11-14-14 per [facility].

## 2014-11-09 NOTE — Telephone Encounter (Signed)
Spoke with pt, he did not want to review meds, he did not have any questions and states he knows his app date and time which was correct. I asked if he would like a call later if it would be more convenient, pt declined. No teaching done due to pt declining, pt was told to call with any questions or concerns.  Pt verbalized understanding.

## 2014-11-13 NOTE — Progress Notes (Signed)
Cardiology Office Note   Date:  11/14/2014   ID:  Alec Brewer, DOB 13-Sep-1960, MRN [id number]  PCP:  Dorrene German, MD  Cardiologist:  Dr. Verne Carrow     Chief Complaint  Patient presents with  . Coronary Artery Disease    s/p anterior STEMI >> BMS to LAD  . Hospitalization Follow-up     History of Present Illness: Alec Brewer is a 54 y.o. 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  Admitted 2/26-2/28 with anterior STEMI.  LHC demonstrated thrombotic LAD lesion treated with a BMS.  Recommendation was to continue Coumadin, ASA and Plavix for one month then continue Plavix alone with coumadin.  He returns for FU.    The patient denies chest pain, shortness of breath, syncope, orthopnea, PND or significant pedal edema.    Studies/Reports Reviewed Today:  Echocardiogram 11/05/14 - EF 30-35% with severe HK;AK of inferior/inferoseptal walls; mild HK elsewhere. Mild LVH. Grade 1 diastolic dysfunction. - Aortic valve: There was mild regurgitation. - Left atrium: The atrium was mildly dilated.   Cardiac Cath/PCI 11/04/14 Left main: No obstructive disease.  Left Anterior Descending Artery:  Proximal 70% followed by thrombus. Mid 40%   Circumflex Artery: No obstructive disease.  Right Coronary Artery:  No obstructive disease.  Left Ventricular Angiogram: LVEF=35-40% with severe HK of the anterior wall and apex.  PCI Note:   4.0 x 23 mm Vision bare metal stent to proximal LAD    Past Medical History  Diagnosis Date  . PE (pulmonary embolism)     s/p IVC filter  . DVT (deep venous thrombosis)   . Hypertension   . CAD S/P percutaneous coronary angioplasty 2/10/14/14    s/p out of hospital MI  . Chronic anticoagulation   . Dyslipidemia   . Smoker     Past Surgical History  Procedure Laterality Date  . Ivc filter    . Cholecystectomy    . Left heart catheterization with coronary angiogram N/A 11/04/2014    Procedure: LEFT  HEART CATHETERIZATION WITH CORONARY ANGIOGRAM;  Surgeon: Kathleene Hazel, MD;  Location: [facility] CATH LAB;  Service: Cardiovascular;  Laterality: N/A;     Current Outpatient Prescriptions  Medication Sig Dispense Refill  . aspirin EC 81 MG EC tablet Take 1 tablet (81 mg total) by mouth daily.    Marland Kitchen atorvastatin (LIPITOR) 80 MG tablet Take 1 tablet (80 mg total) by mouth daily at 6 PM. 30 tablet 11  . carvedilol (COREG) 6.25 MG tablet Take 1 tablet (6.25 mg total) by mouth 2 (two) times daily with a meal. 60 tablet 11  . clopidogrel (PLAVIX) 75 MG tablet Take 1 tablet (75 mg total) by mouth daily with breakfast. 30 tablet 11  . cyclobenzaprine (FLEXERIL) 10 MG tablet Take 10 mg by mouth 3 (three) times daily as needed for muscle spasms.     Marland Kitchen gabapentin (NEURONTIN) 300 MG capsule Take 300 mg by mouth daily.     . hydrochlorothiazide (HYDRODIURIL) 25 MG tablet Take 25 mg by mouth daily.     Marland Kitchen HYDROcodone-acetaminophen (NORCO/VICODIN) 5-325 MG per tablet Take 1 tablet by mouth daily as needed.  0  . ibuprofen (ADVIL,MOTRIN) 800 MG tablet Take 800 mg by mouth 2 (two) times daily as needed (pain).     Marland Kitchen lisinopril (PRINIVIL,ZESTRIL) 20 MG tablet Take 20 mg by mouth daily.    . nitroGLYCERIN (NITROLINGUAL) 0.4 MG/SPRAY spray Place 1 spray under the tongue every  5 (five) minutes x 3 doses as needed for chest pain. 12 g 2  . QUEtiapine (SEROQUEL XR) 400 MG 24 hr tablet Take 800 mg by mouth at bedtime.     Marland Kitchen warfarin (COUMADIN) 5 MG tablet Take 5 mg by mouth daily.     . ziprasidone (GEODON) 80 MG capsule Take 80 mg by mouth daily.      No current facility-administered medications for this visit.    Allergies:   Review of patient's allergies indicates no known allergies.    Social History:  The patient  reports that he has been smoking.  He does not have any smokeless tobacco history on file. He reports that he drinks about 1.2 oz of alcohol per week. He reports that he does not use illicit drugs.    Family History:  The patient's family history is negative for Heart attack and Stroke.    ROS:   Please see the history of present illness.   Review of Systems  Constitution: Positive for chills.  Musculoskeletal: Positive for back pain.  All other systems reviewed and are negative.   PHYSICAL EXAM: VS:  BP 90/70 mmHg  Pulse 70  Ht  (1.93 m)  Wt 220 lb (99.791 kg)  BMI 26.79 kg/m2    Wt Readings from Last 3 Encounters:  11/14/14 220 lb (99.791 kg)  11/06/14 218 lb (98.884 kg)     GEN: Well nourished, well developed, in no acute distress HEENT: normal Neck: no JVD, no masses Cardiac:  Normal S1/S2, RRR; no murmur , no rubs or gallops, no edema ; right wrist without hematoma or mass  Respiratory:  clear to auscultation bilaterally, no wheezing, rhonchi or rales. GI: soft, nontender, nondistended, + BS MS: no deformity or atrophy Skin: warm and dry ; Lipoma noted on back (thoracic spine) Neuro:  CNs II-XII intact, Strength and sensation are intact Psych: Normal affect   EKG:  EKG is ordered today.  It demonstrates:   NSR, HR 70, LAD, inferior Q waves, anterior Q waves, T-wave inversions in V4-V6   Recent Labs: 11/06/2014: BUN 7; Creatinine 0.97; Hemoglobin 17.0; Platelets 220; Potassium 3.5; Sodium 136    Lipid Panel    Component Value Date/Time   CHOL 177 11/05/2014 0442   TRIG 127 11/05/2014 0442   HDL 34* 11/05/2014 0442   CHOLHDL 5.2 11/05/2014 0442   VLDL 25 11/05/2014 0442   LDLCALC 118* 11/05/2014 0442      ASSESSMENT AND PLAN:  Coronary Artery Disease:  Doing well post anterior STEMI tx with BMS to LAD.  He is on chronic Coumadin for DVT. He will remain on triple Rx for 1 month post PCI.  He has transportation issues and may not be able to attend cardiac rehab.      -  DC ASA after 3/28.  Remain on Plavix and Coumadin.    -  Continue ASA, statin.    -  Attend cardiac rehab if possible.  Otherwise, I have encouraged him to continue with his  walking.   Ischemic Cardiomyopathy:  Continue beta blocker, ACEI.  BP too low to titrate medications further.      -  Arrange FU echo 90 days post MI.  If EF < 35% refer to EP for ICD.  Essential hypertension:  BP running low today.  He is not symptomatic.  He tells me that he has not taken any medications yet today.      -  DC HCTZ.  He has  FU later this week with his PCP.  Dyslipidemia:  Continue statin.      -  Lipids/LFTs 6 weeks.  History of PE DVT- S/P IVC filter:  Coumadin followed by PCP.  He has not seen his PCP yet.  I have encouraged him to FU this week.  Smoker:  He is down to 2 cigs per day.  I have encouraged him to quit.     Current medicines are reviewed at length with the patient today.  The patient does not have concerns regarding medicines.  The following changes have been made:  Stop HCTZ. DC ASA 30 days after MI.  Labs/ tests ordered today include:  Orders Placed This Encounter  Procedures  . Basic Metabolic Panel (BMET)  . Lipid Profile  . Hepatic function panel  . EKG 12-Lead  . 2D Echocardiogram without contrast    Disposition:   FU with Dr. Verne Carrow 6 weeks.    Signed, Brynda Rim, MHS 11/14/2014 9:27 AM    [facility] [address] [place], [place], [place]  [address] Phone: [phone]; Fax: [phone]

## 2014-11-14 ENCOUNTER — Encounter: Payer: Self-pay | Admitting: Physician Assistant

## 2014-11-14 ENCOUNTER — Ambulatory Visit (INDEPENDENT_AMBULATORY_CARE_PROVIDER_SITE_OTHER): Payer: Medicare Other | Admitting: Physician Assistant

## 2014-11-14 ENCOUNTER — Telehealth: Payer: Self-pay | Admitting: *Deleted

## 2014-11-14 VITALS — BP 90/70 | HR 70 | Ht 76.0 in | Wt 220.0 lb

## 2014-11-14 DIAGNOSIS — I255 Ischemic cardiomyopathy: Secondary | ICD-10-CM

## 2014-11-14 DIAGNOSIS — Z9861 Coronary angioplasty status: Secondary | ICD-10-CM | POA: Diagnosis not present

## 2014-11-14 DIAGNOSIS — I2510 Atherosclerotic heart disease of native coronary artery without angina pectoris: Secondary | ICD-10-CM | POA: Diagnosis not present

## 2014-11-14 DIAGNOSIS — I10 Essential (primary) hypertension: Secondary | ICD-10-CM

## 2014-11-14 DIAGNOSIS — E785 Hyperlipidemia, unspecified: Secondary | ICD-10-CM | POA: Diagnosis not present

## 2014-11-14 DIAGNOSIS — Z86711 Personal history of pulmonary embolism: Secondary | ICD-10-CM

## 2014-11-14 DIAGNOSIS — Z720 Tobacco use: Secondary | ICD-10-CM

## 2014-11-14 DIAGNOSIS — F17200 Nicotine dependence, unspecified, uncomplicated: Secondary | ICD-10-CM

## 2014-11-14 LAB — BASIC METABOLIC PANEL
BUN: 10 mg/dL (ref 6–23)
CALCIUM: 10.5 mg/dL (ref 8.4–10.5)
CHLORIDE: 96 meq/L (ref 96–112)
CO2: 34 meq/L — AB (ref 19–32)
Creatinine, Ser: 1.14 mg/dL (ref 0.40–1.50)
GFR: 85.88 mL/min (ref >60.00)
GLUCOSE: 123 mg/dL — AB (ref 70–99)
POTASSIUM: 3.8 meq/L (ref 3.5–5.1)
Sodium: 133 mEq/L — ABNORMAL LOW (ref 135–145)

## 2014-11-14 NOTE — Telephone Encounter (Signed)
pt notified about lab results with verbal understanding  

## 2014-11-14 NOTE — Patient Instructions (Signed)
LAB WORK TODAY; BMET  LAB WORK IN 6 WEEKS FOR A FASTING LIPID AND LIVER PANEL  Your physician recommends that you schedule a follow-up appointment in: 6 WEEKS WITH DR. Clifton James  Your physician has requested that you have an echocardiogram WITH OUT CONTRAST TO BE DONE AFTER 02/02/15. Echocardiography is a painless test that uses sound waves to create images of your heart. It provides your doctor with information about the size and shape of your heart and how well your heart's chambers and valves are working. This procedure takes approximately one hour. There are no restrictions for this procedure.   Your physician has recommended you make the following change in your medication:  1. STOP HCTZ 2. STOP ASPRIN AFTER 12/05/14  PER SCOTT WEAVER, PAC YOU WILL NEED TO SCHEDULE AN APPT WITH PCP FOR YOUR INR AND BLOOD PRESSURE TO BE CHECKED

## 2014-11-16 NOTE — Discharge Summary (Signed)
Patient ID: Alec Brewer,  MRN: [id number], DOB/AGE: Mar 26, 1961 54 y.o.  Admit date: 11/04/2014 Discharge date: 11/16/2014  Primary Care Provider: Dorrene German, MD Primary Cardiologist: Dr Alec Brewer  Discharge Diagnoses Principal Problem:   STEMI involving LAD  Active Problems:   Myocardial infarct-out of hospital Feb 2016   CAD - S/P LAD BMS 11/04/14   Hypertension   History of PE DVT- S/P IVC filter   Chronic anticoagulation with Coumadin   Cardiomyopathy, ischemic- 30-35%-2D 11/06/14   Smoker   Dyslipidemia    Procedures: Urgent Cath/ LAD BMS 11/04/14   Hospital Course:  54 yo male with history of HTN, tobacco abuse, DVT/PE s/p IVC filter on chronic coumadin therapy presented to ED with c/o chest pain/bilateral leg pain. EKG with subtle ST elevations anterolateral leads. Taken for emergent cath on 2/26. LAD with 70% thrombotic lesion. Treated with BMS. EF 35-40% at cath. Echo prior to DC showed an EF of 30-35%.   Discharge Vitals:  Blood pressure 124/89, pulse 73, temperature 98.2 F (36.8 C), temperature source Oral, resp. rate 18, height 6\' 4"  (1.93 m), weight 218 lb (98.884 kg), SpO2 96 %.    Labs: No results found for this or any previous visit (from the past 24 hour(s)).  Disposition:      Follow-up Information    Follow up with Alec Meres, MD.   Specialty:  Cardiology   Why:  office will call you   Contact information:   [address] [place] [address] [phone]       Discharge Medications:    Medication List    STOP taking these medications        cephALEXin 500 MG capsule  Commonly known as:  KEFLEX     chlorthalidone 25 MG tablet  Commonly known as:  HYGROTON     hydrochlorothiazide 25 MG tablet  Commonly known as:  HYDRODIURIL     methylPREDNIsolone 4 MG tablet  Commonly known as:  MEDROL DOSPACK     oxyCODONE-acetaminophen 5-325 MG per tablet  Commonly known as:  PERCOCET/ROXICET      TAKE these  medications        aspirin 81 MG EC tablet  Take 1 tablet (81 mg total) by mouth daily.     atorvastatin 80 MG tablet  Commonly known as:  LIPITOR  Take 1 tablet (80 mg total) by mouth daily at 6 PM.     carvedilol 6.25 MG tablet  Commonly known as:  COREG  Take 1 tablet (6.25 mg total) by mouth 2 (two) times daily with a meal.     clopidogrel 75 MG tablet  Commonly known as:  PLAVIX  Take 1 tablet (75 mg total) by mouth daily with breakfast.     cyclobenzaprine 10 MG tablet  Commonly known as:  FLEXERIL  Take 10 mg by mouth 3 (three) times daily as needed for muscle spasms.     gabapentin 300 MG capsule  Commonly known as:  NEURONTIN  Take 300 mg by mouth daily.     HYDROcodone-acetaminophen 5-325 MG per tablet  Commonly known as:  NORCO/VICODIN  Take 1 tablet by mouth daily as needed.     ibuprofen 800 MG tablet  Commonly known as:  ADVIL,MOTRIN  Take 800 mg by mouth 2 (two) times daily as needed (pain).     lisinopril 20 MG tablet  Commonly known as:  PRINIVIL,ZESTRIL  Take 20 mg by mouth daily.     nitroGLYCERIN  0.4 MG/SPRAY spray  Commonly known as:  NITROLINGUAL  Place 1 spray under the tongue every 5 (five) minutes x 3 doses as needed for chest pain.     QUEtiapine 400 MG 24 hr tablet  Commonly known as:  SEROQUEL XR  Take 800 mg by mouth at bedtime.     warfarin 5 MG tablet  Commonly known as:  COUMADIN  Take 5 mg by mouth daily.     ziprasidone 80 MG capsule  Commonly known as:  GEODON  Take 80 mg by mouth daily.         Duration of Discharge Encounter: Greater than 30 minutes including physician time.  [name], [name] PA-C 11/16/2014 11:09 AM

## 2014-12-08 ENCOUNTER — Emergency Department (HOSPITAL_COMMUNITY)
Admission: EM | Admit: 2014-12-08 | Discharge: 2014-12-09 | Disposition: E | Payer: Medicare Other | Attending: Emergency Medicine | Admitting: Emergency Medicine

## 2014-12-08 ENCOUNTER — Encounter (HOSPITAL_COMMUNITY): Payer: Self-pay | Admitting: Neurology

## 2014-12-08 DIAGNOSIS — R079 Chest pain, unspecified: Secondary | ICD-10-CM | POA: Insufficient documentation

## 2014-12-08 DIAGNOSIS — I469 Cardiac arrest, cause unspecified: Secondary | ICD-10-CM | POA: Insufficient documentation

## 2014-12-08 HISTORY — DX: Acute myocardial infarction, unspecified: I21.9

## 2014-12-08 MED ORDER — SODIUM BICARBONATE 8.4 % IV SOLN
INTRAVENOUS | Status: AC | PRN
  Administered 2014-12-08: 50 meq via INTRAVENOUS

## 2014-12-08 MED ORDER — EPINEPHRINE HCL 0.1 MG/ML IJ SOSY
PREFILLED_SYRINGE | INTRAMUSCULAR | Status: AC | PRN
  Administered 2014-12-08: 1 mg via INTRAVENOUS

## 2014-12-08 MED ORDER — CALCIUM CHLORIDE 10 % IV SOLN
INTRAVENOUS | Status: AC | PRN
  Administered 2014-12-08: 1 g via INTRAVENOUS

## 2014-12-08 MED FILL — Medication: Qty: 1 | Status: AC

## 2014-12-09 ENCOUNTER — Encounter: Payer: Self-pay | Admitting: Physician Assistant

## 2014-12-09 NOTE — ED Provider Notes (Addendum)
CSN: [id number]     Arrival date & time 01/06/15  1208/01/28 History   First MD Initiated Contact with Patient 2015-01-06 01-27-1218     Chief Complaint  Patient presents with  . Cardiac Arrest     (Consider location/radiation/quality/duration/timing/severity/associated sxs/prior Treatment) Patient is a 54 y.o. male presenting with general illness. The history is provided by the EMS personnel.  Illness Location:  [place] Quality:  Chest pain Severity:  Severe Onset quality:  Sudden Timing:  Constant Progression:  Unchanged Chronicity:  Recurrent Context:  Was eating and complaining of chest pain. First responders initiated CPR. He received multilpe shocks for Vfib and multiple rounds of epinephrine PTA. He also was started on an amiodarone drip. Relieved by:  Nothing Worsened by:  Nothing Associated symptoms: chest pain     No past medical history on file. No past surgical history on file. No family history on file. History  Substance Use Topics  . Smoking status: Not on file  . Smokeless tobacco: Not on file  . Alcohol Use: Not on file   OB History    No data available     Review of Systems  Unable to perform ROS: Intubated  Cardiovascular: Positive for chest pain.     Level V caveat - Intubated, CPR in progress.  Allergies  Review of patient's allergies indicates not on file.  Home Medications   Prior to Admission medications   Not on File   There were no vitals taken for this visit. Physical Exam  Constitutional: He is oriented to person, place, and time. He appears well-developed and well-nourished. He appears distressed. He is intubated.  HENT:  Head: Normocephalic and atraumatic.  Mouth/Throat: No oropharyngeal exudate.  Eyes: EOM are normal. Pupils are equal, round, and reactive to light.  Neck: Normal range of motion. Neck supple.  Cardiovascular: Normal rate and regular rhythm.  Exam reveals no friction rub.   No murmur heard. Pulmonary/Chest: He is  intubated. No respiratory distress. He has decreased breath sounds (mildly, on left). He has no wheezes. He has no rales.  Abdominal: He exhibits no distension. There is no tenderness. There is no rebound.  Musculoskeletal: Normal range of motion. He exhibits no edema.  Neurological: He is alert and oriented to person, place, and time.  Skin: He is not diaphoretic.  Nursing note and vitals reviewed.   ED Course  Procedures (including critical care time) Labs Review Labs Reviewed - No data to display  Imaging Review No results found.   EKG Interpretation None     Cardiopulmonary Resuscitation (CPR) Procedure Note Directed/Performed by: Dagmar Hait I personally directed ancillary staff and/or performed CPR in an effort to regain return of spontaneous circulation and to maintain cardiac, neuro and systemic perfusion.   MDM   Final diagnoses:  Cardiac arrest    African-American male roughly in his 11s here as a CPR in progress. Was complaining of chest pain leading at [place]. EMS and first responders initiated CPR. He was intubated. He had multiple rounds of epinephrine and multiple shocks for V. fib. He also was started on amiodarone. He never had ROSC. Patient here with TIMI minutes of CPR. He had 20-25 minutes of CPR prior to arrival. Continued rounds of epinephrine without any return of pulses or discernible rhythm. A cyst on the monitor during pulse checks. During last pulse check, ASIS on the monitor, no cardiac motion noted on bedside ultrasound. Time of death 1215-01-28. Review of his records using the ID from his  wallet state he had a heart attack one month ago.  Lung sounds with decreased breath sounds on the left, tube retracted about 1-2 cm with improved, equal breath sounds.  Elwin Mocha, MD 12-11-2014 1226  Elwin Mocha, MD 12-11-14 1226

## 2014-12-09 NOTE — Progress Notes (Signed)
Chaplain responded to page from ED for patient who passed. Chaplain spoke with ED doctor to see if he had spoke with family and given them status of patient. He had. I escorted pt wife, son and friend to bedside to spend time with deceased. Promoted information sharing between staff and family. Son was given patient placement card and instructed how to follow up. Nurse was informed. Provide empathetic listening, presence, emotional and grief support. I remained with family until their departure.   12-13-14 1300  Clinical Encounter Type  Visited With Patient and family together;Health care provider  Visit Type Death;ED  Referral From Chaplain  Spiritual Encounters  Spiritual Needs Prayer;Emotional;Grief support  Stress Factors  Family Stress Factors Exhausted;Family relationships;Loss;Major life changes  Venida Jarvis, [place] [phone]

## 2014-12-09 NOTE — Code Documentation (Signed)
Dr. Gwendolyn Grant using [place] to examine cardiac activity. None seen with ultrasound. Asystole on the monitor.

## 2014-12-09 NOTE — Code Documentation (Signed)
Patient time of death occurred at 1216. 

## 2014-12-09 NOTE — Progress Notes (Signed)
Chaplain responded to page from ED for patient who passed. Chaplain escorted pt wife and son to consultation room A. Chaplain present when MD notified family of death. Chaplain handing off to chaplain [facility] for follow up. Teala Daffron, Mayer Masker, Chaplain 31-Dec-2014 1:17 PM

## 2014-12-09 NOTE — ED Notes (Signed)
Per EMS- Pt was eating [place] with friend, c/o cp then had witnessed arrest. Fire Dept on scene initially pulseless and apneic. 4 mins downtime prior to cpr being started by Medical illustrator. EMS given 2 round CPR, defib x 2. When EMS arrived initially course vfib (4 defib with EMS). Given 5 epi, D 50, 450 mg Amniodarone. 8.0 ETT tube. Capnography 26-30. When pt arrived to ED, asystole, Samuel Bouche being used.

## 2014-12-09 DEATH — deceased

## 2015-01-06 ENCOUNTER — Other Ambulatory Visit: Payer: Medicare Other

## 2015-01-06 ENCOUNTER — Ambulatory Visit: Payer: Medicare Other | Admitting: Cardiovascular Disease

## 2015-02-07 ENCOUNTER — Other Ambulatory Visit (HOSPITAL_COMMUNITY): Payer: Medicare Other
